# Patient Record
Sex: Male | Born: 1945 | Race: Black or African American | Hispanic: No | Marital: Single | Smoking: Never smoker
Health system: Southern US, Community
[De-identification: ages and names within clinical notes are randomized; demographics above are authoritative.]

## PROBLEM LIST (undated history)

## (undated) DIAGNOSIS — R32 Unspecified urinary incontinence: Secondary | ICD-10-CM

## (undated) DIAGNOSIS — I482 Chronic atrial fibrillation, unspecified: Secondary | ICD-10-CM

## (undated) DIAGNOSIS — Z7901 Long term (current) use of anticoagulants: Secondary | ICD-10-CM

## (undated) DIAGNOSIS — Z9079 Acquired absence of other genital organ(s): Secondary | ICD-10-CM

## (undated) DIAGNOSIS — I10 Essential (primary) hypertension: Secondary | ICD-10-CM

## (undated) HISTORY — DX: Unspecified urinary incontinence: R32

## (undated) HISTORY — DX: Essential (primary) hypertension: I10

## (undated) HISTORY — PX: COLONOSCOPY: GILAB00002

## (undated) HISTORY — DX: Chronic atrial fibrillation, unspecified: I48.20

## (undated) HISTORY — DX: Long term (current) use of anticoagulants: Z79.01

## (undated) HISTORY — DX: Acquired absence of other genital organ(s): Z90.79

## (undated) HISTORY — PX: APPENDECTOMY: SHX54

---

## 2003-10-04 DIAGNOSIS — I482 Chronic atrial fibrillation, unspecified: Secondary | ICD-10-CM

## 2003-10-04 HISTORY — DX: Chronic atrial fibrillation, unspecified: I48.20

## 2006-10-03 DIAGNOSIS — Z9079 Acquired absence of other genital organ(s): Secondary | ICD-10-CM

## 2006-10-03 HISTORY — DX: Acquired absence of other genital organ(s): Z90.79

## 2012-02-01 HISTORY — PX: STRABISMUS CORRECTION, SURGICAL: SHX001610

## 2017-09-08 ENCOUNTER — Emergency Department (HOSPITAL_COMMUNITY): Payer: No Typology Code available for payment source

## 2017-09-08 ENCOUNTER — Emergency Department (HOSPITAL_COMMUNITY)
Admission: EM | Admit: 2017-09-08 | Discharge: 2017-09-08 | Disposition: A | Discharge status: Home / Self Care | Payer: No Typology Code available for payment source | Attending: Emergency Medicine | Admitting: Emergency Medicine

## 2017-09-08 ENCOUNTER — Encounter (HOSPITAL_COMMUNITY): Payer: Self-pay

## 2017-09-08 DIAGNOSIS — R109 Unspecified abdominal pain: Secondary | ICD-10-CM | POA: Insufficient documentation

## 2017-09-08 DIAGNOSIS — R202 Paresthesia of skin: Secondary | ICD-10-CM | POA: Insufficient documentation

## 2017-09-08 DIAGNOSIS — Y999 Unspecified external cause status: Secondary | ICD-10-CM | POA: Insufficient documentation

## 2017-09-08 DIAGNOSIS — S8001XA Contusion of right knee, initial encounter: Secondary | ICD-10-CM

## 2017-09-08 DIAGNOSIS — S0990XA Unspecified injury of head, initial encounter: Secondary | ICD-10-CM | POA: Diagnosis present

## 2017-09-08 DIAGNOSIS — S20211A Contusion of right front wall of thorax, initial encounter: Secondary | ICD-10-CM | POA: Insufficient documentation

## 2017-09-08 DIAGNOSIS — Y929 Unspecified place or not applicable: Secondary | ICD-10-CM | POA: Insufficient documentation

## 2017-09-08 DIAGNOSIS — Y939 Activity, unspecified: Secondary | ICD-10-CM | POA: Diagnosis not present

## 2017-09-08 LAB — COMPREHENSIVE METABOLIC PANEL
ALBUMIN: 4 g/dL (ref 3.5–5.0)
ALK PHOS: 53 U/L (ref 38–126)
ALT: 23 U/L (ref 17–63)
AST: 22 U/L (ref 15–41)
Anion gap: 8 (ref 5–15)
BILIRUBIN TOTAL: 0.8 mg/dL (ref 0.3–1.2)
BUN: 18 mg/dL (ref 6–20)
CALCIUM: 8.9 mg/dL (ref 8.9–10.3)
CO2: 25 mmol/L (ref 22–32)
Chloride: 104 mmol/L (ref 101–111)
Creatinine, Ser: 1.07 mg/dL (ref 0.61–1.24)
GFR calc Af Amer: >60 mL/min (ref >60)
GFR calc non Af Amer: >60 mL/min (ref >60)
GLUCOSE: 92 mg/dL (ref 65–99)
POTASSIUM: 4.2 mmol/L (ref 3.5–5.1)
SODIUM: 137 mmol/L (ref 135–145)
TOTAL PROTEIN: 7.4 g/dL (ref 6.5–8.1)

## 2017-09-08 LAB — CBC WITH DIFFERENTIAL/PLATELET
BASOS ABS: 0 10*3/uL (ref 0.0–0.1)
Basophils Relative: 0 %
EOS PCT: 1 %
Eosinophils Absolute: 0 10*3/uL (ref 0.0–0.7)
HEMATOCRIT: 45.2 % (ref 39.0–52.0)
Hemoglobin: 15.7 g/dL (ref 13.0–17.0)
LYMPHS PCT: 45 %
Lymphs Abs: 1.5 10*3/uL (ref 0.7–4.0)
MCH: 31.2 pg (ref 26.0–34.0)
MCHC: 34.7 g/dL (ref 30.0–36.0)
MCV: 89.7 fL (ref 78.0–100.0)
MONO ABS: 0.3 10*3/uL (ref 0.1–1.0)
MONOS PCT: 9 %
Neutro Abs: 1.5 10*3/uL — ABNORMAL LOW (ref 1.7–7.7)
Neutrophils Relative %: 45 %
PLATELETS: 200 10*3/uL (ref 150–400)
RBC: 5.04 MIL/uL (ref 4.22–5.81)
RDW: 13.8 % (ref 11.5–15.5)
WBC: 3.4 10*3/uL — ABNORMAL LOW (ref 4.0–10.5)

## 2017-09-08 MED ORDER — IOPAMIDOL (ISOVUE-300) INJECTION 61%
INTRAVENOUS | Status: AC
  Administered 2017-09-08: 100 mL via INTRAVENOUS
  Filled 2017-09-08: qty 100

## 2017-09-08 MED ORDER — ONDANSETRON HCL 4 MG/2ML IJ SOLN
4.0000 mg | Freq: Once | INTRAMUSCULAR | Status: AC
  Administered 2017-09-08: 4 mg via INTRAVENOUS
  Filled 2017-09-08: qty 2

## 2017-09-08 MED ORDER — FENTANYL CITRATE (PF) 100 MCG/2ML IJ SOLN
50.0000 ug | Freq: Once | INTRAMUSCULAR | Status: AC
  Administered 2017-09-08: 50 ug via INTRAVENOUS
  Filled 2017-09-08: qty 2

## 2017-09-08 NOTE — ED Notes (Signed)
Ambulated pt. Pt's gait steady on his feet.
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
## 2017-09-08 NOTE — ED Notes (Signed)
Pt. States he has a sharp pain running up and down his back.
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
## 2017-09-08 NOTE — Discharge Instructions (Signed)
You had CT scans performed in the emergency department today.  The scan of your neck showed that you have arthritis in your neck and will need to be followed up by your family doctor.  You can take Tylenol or ibuprofen, available over-the-counter as needed for pain according to label instructions.  Get rechecked immediately if you develop any numbness, weakness, severe pain or new concerning symptoms.

## 2017-09-14 ENCOUNTER — Encounter (HOSPITAL_COMMUNITY): Payer: Self-pay | Admitting: Emergency Medicine

## 2017-09-14 ENCOUNTER — Emergency Department (HOSPITAL_COMMUNITY)
Admission: EM | Admit: 2017-09-14 | Discharge: 2017-09-14 | Disposition: A | Discharge status: Home / Self Care | Payer: Medicare Other | Attending: Emergency Medicine | Admitting: Emergency Medicine

## 2017-09-14 DIAGNOSIS — E7849 Other hyperlipidemia: Secondary | ICD-10-CM | POA: Insufficient documentation

## 2017-09-14 DIAGNOSIS — Z8546 Personal history of malignant neoplasm of prostate: Secondary | ICD-10-CM | POA: Insufficient documentation

## 2017-09-14 DIAGNOSIS — H9313 Tinnitus, bilateral: Secondary | ICD-10-CM | POA: Insufficient documentation

## 2017-09-14 DIAGNOSIS — K625 Hemorrhage of anus and rectum: Secondary | ICD-10-CM | POA: Diagnosis not present

## 2017-09-14 DIAGNOSIS — K649 Unspecified hemorrhoids: Secondary | ICD-10-CM | POA: Insufficient documentation

## 2017-09-14 DIAGNOSIS — S39012D Strain of muscle, fascia and tendon of lower back, subsequent encounter: Secondary | ICD-10-CM | POA: Diagnosis not present

## 2017-09-14 LAB — COMPREHENSIVE METABOLIC PANEL
ALBUMIN: 4.2 g/dL (ref 3.5–5.0)
ALT: 29 U/L (ref 17–63)
AST: 28 U/L (ref 15–41)
Alkaline Phosphatase: 64 U/L (ref 38–126)
Anion gap: 8 (ref 5–15)
BUN: 21 mg/dL — AB (ref 6–20)
CHLORIDE: 104 mmol/L (ref 101–111)
CO2: 24 mmol/L (ref 22–32)
CREATININE: 1.07 mg/dL (ref 0.61–1.24)
Calcium: 8.8 mg/dL — ABNORMAL LOW (ref 8.9–10.3)
GFR calc Af Amer: >60 mL/min (ref >60)
Glucose, Bld: 115 mg/dL — ABNORMAL HIGH (ref 65–99)
POTASSIUM: 3.9 mmol/L (ref 3.5–5.1)
SODIUM: 136 mmol/L (ref 135–145)
Total Bilirubin: 0.7 mg/dL (ref 0.3–1.2)
Total Protein: 7.4 g/dL (ref 6.5–8.1)

## 2017-09-14 LAB — CBC
HEMATOCRIT: 43.8 % (ref 39.0–52.0)
Hemoglobin: 15.3 g/dL (ref 13.0–17.0)
MCH: 31.4 pg (ref 26.0–34.0)
MCHC: 34.9 g/dL (ref 30.0–36.0)
MCV: 89.8 fL (ref 78.0–100.0)
Platelets: 211 10*3/uL (ref 150–400)
RBC: 4.88 MIL/uL (ref 4.22–5.81)
RDW: 13.5 % (ref 11.5–15.5)
WBC: 3.6 10*3/uL — AB (ref 4.0–10.5)

## 2017-09-14 LAB — ABO/RH: ABO/RH(D): O POS

## 2017-09-14 LAB — TYPE AND SCREEN
ABO/RH(D): O POS
Antibody Screen: NEGATIVE

## 2017-09-14 MED ORDER — HYDROCORTISONE ACETATE 25 MG RE SUPP: 25.0000 mg | Freq: Two times a day (BID) | RECTAL | 0 refills | Status: AC

## 2017-09-14 MED ORDER — METHOCARBAMOL 500 MG PO TABS: 500.0000 mg | ORAL_TABLET | Freq: Three times a day (TID) | ORAL | 0 refills | Status: AC | PRN
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
## 2017-09-14 NOTE — Discharge Instructions (Signed)
Continue as needed Tylenol and Motrin.as needed Tylenol and Motrin. Robaxin, muscle relaxant as needed. Return to ER with worsening bleeding, passage of blood without bowel movement, lightheadedness, or other changes.
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
## 2018-06-26 ENCOUNTER — Encounter: Payer: Self-pay | Admitting: Urology

## 2018-07-23 ENCOUNTER — Ambulatory Visit: Payer: Medicare Other | Admitting: Urology

## 2018-07-30 DIAGNOSIS — Z7901 Long term (current) use of anticoagulants: Secondary | ICD-10-CM | POA: Insufficient documentation

## 2018-08-16 ENCOUNTER — Ambulatory Visit: Payer: Medicare Other | Attending: Urology | Admitting: Urology

## 2018-08-16 ENCOUNTER — Encounter: Payer: Self-pay | Admitting: Urology

## 2018-08-16 VITALS — BP 127/84 | HR 97

## 2018-08-16 DIAGNOSIS — N393 Stress incontinence (female) (male): Principal | ICD-10-CM | POA: Insufficient documentation

## 2018-08-16 DIAGNOSIS — Z9689 Presence of other specified functional implants: Secondary | ICD-10-CM | POA: Insufficient documentation

## 2018-08-16 NOTE — Patient Instructions (Addendum)
Please make an appointment for cystoscopy and preoperative appointment with Dr. Toni ArthursBach and myself.     What You Should Know About Cystoscopy      Your urologist has asked you to schedule a test called Cystoscopy.  This test entails inserting a small scope into the urethra (the tube urine comes out of) in order to be able to visualize the structure and anatomy of the urethra and the bladder.    You will first be asked to void (empty out your bladder), and then you will be catheterized to drain any residual urine in your bladder.  The scope (camera) will then be inserted into the urethra after a small amount of local anesthetic jelly is placed into the urethra.  Usually, the scope will be attached to a video camera so you can visualize the inside of your bladder on a monitor while your urologist is examining it.  the test usually takes approximately 10 minutes. Antibiotics are usually not prescribed as they are not routinely indicated.     You will have burning with urination for few hours after the test.  This should resolve on its own.     Why is there a need to undergo Cystoscopy?  Cystoscopy allows your doctor to examine the inside of your bladder.  It is very important to evaluate the structure of the urethra if you have stress incontinence or urethral irritation or pain.  It is important to examine the bladder if you have urge incontinence, bladder pain, blood in your urine, or other abnormalities which require examination of the inside of the bladder.  Cystoscopy is the only reliable test for identification of bladder stones, tumors, and other abnormalities within the bladder such as a stitch from a previous surgery, a fistula, or other abnormalities which may be responsible for your symptoms.      * This information is for educational purposes only and should not be relied upon as medical advice.  It has not been designed to replace a physician's independent judgment about the appropriateness or risks of a  procedure for a given patient.

## 2018-08-16 NOTE — Progress Notes (Addendum)
No Pcp Per Patient  No address on file    WJ:XBJYNWGRE:Charles Decker  MRN: [id number]  DOB: 01/15/1946    Date of visit: 08/16/2018    Dear Patient,    Chief complaint:   Chief Complaint   Patient presents with    Consultation       HPI:  Today in the Urology Clinic, I had the pleasure of seeing Charles Decker who is a [age]yr male referred for consultation regarding post prostatectomy stress urinary incontinence. Patient is here in clinic today together with his wife. Patient and his wife have lived in [place] and [place], [place] and [place] in [place]. Patient has a reported history of prostate cancer s/p robotic prostatectomy approximately Oct 2008. Patient developed post prostatectomy urinary incontinence, failed physical therapy after 1 year. In July 2010 AUS device #1 was placed by urologist in [place]. Patient states this first AUS device cycled normally, but did not function properly for incontinencne. After one year of urinary incontinence with AUS in place, patient consulted with a different urologist in [place], had second new AUS surgery was done - upon review of EMR paperwork - it looks like second cuff was applied and reservoir was increased in fluid. Patient is uncertain if reservoir was removed, but states he went from single to double cuff at that time. He recalls that a scrotal incision was made. Additionally, surgery was complicated by hernia, which was repaired with mesh as the time of AUS #3 replacement. AUS #3 was 75% effective by [facility]. Third AUS was very effective in treating urinary incontinence for several years. However, patient developed worsening stress incontinence 3 months ago. He states that the device is working 50%. Leakage is managed with 4-5 pads daily. Patient believes that AUS reservoir may have shifted from right to left scrotum. He confirms decreased fluid in the reservoir. Patient and his wife would like to discuss removal and  replacement of AUS today. Pt leaks 4-5 large pads per day.     Patient has additional pertinent medical history of afib and hypertension, currently on Coumadin, previously monitored by cardiologist in [place]. Patient's wife states that after moving from [place] 1 year ago, cardiologist was satisfied with current cardiac care. He is now mostly followed by his PCP. Patient states he has not yet discussed desire to have surgery and cardiac care with his PCP. Patient reports normal PSA levels since prostatectomy.     I personally reviewed and summarized old medical records and have submitted to be scanned into our EMR selected documents: no    Images:  I personally viewed the following images: No images available.  I discussed test results with performing physician: no  I independently visualized images myself, no report was available: no    Labs:  No results found for: NA, K, CL, CO2, BUN, CR, GLU  No results found for: WBC, HGB, HCT, PLT    Allergies:   Patient has no known allergies.     Past Medical History:  Past Medical History:   Diagnosis Date    A-fib (HCC)     Hypertension         Past Surgical History:  Past Surgical History:   Procedure Laterality Date    INSERTION, ARTIFICIAL URINARY SPHINCTER, ADULT      REMOVAL, ARTIFICIAL SPHINCTER, URINARY         Family History:  No family history on file.     Social History:  Social  History     Tobacco Use    Smoking status: Never Smoker    Smokeless tobacco: Never Used   Substance Use Topics    Alcohol use: Not on file       No current outpatient medications on file.     No current facility-administered medications for this visit.        Review of Systems:   A complete review of symptoms reveal no headache, no fever/ chills, no CP, no SOB, no abdominal pain, no edema in extremities. Please see HPI, all other systems are negative    Physical Exam:   BP 127/84 (SITE: left arm, Orthostatic Position: sitting, Cuff Size: regular)   Pulse 97   Appearance: in no  apparent distress, appears to be in good physical condition, mood appropriate, responds appropriately to spoken word with non labored breathing.    CV: Pulses intact  Resp: Unlabored breathing  Abd: Soft, NT, ND,   Skin: No excoriations, lacerations, or rashes  Extremities: No cyanosis, clubbing, or edema  Neuro: Gait normal. Reflexes normal and symmetric. Sensation and strength grossly normal  Psychiatric: Mood appears normal  GU Exam:  Penis: circumcised, Meatus:  appearing  normal, Testes:  Bilaterally, right sided AUS in place   Right lower quadrant incision well healed. No tenderness, induration or signs of infection along the tubing, pump, or with palpation of cuff. AUS was cycled with limited fluid felt within pump  Pump in midline of hemiscrotum with palpable tubing, suspect potential penoscrotal placement?   Chaperone was present during physical exam.     Impression:   In review, this is a 72yr old male with history of prostate cancer s/p RALP Oct 2008 resulting in stress urinary incontinence s/p 3 AUS placements, now with worsening urinary incontinence. Additional history of Afib and hypertension, on Coumadin.  Hx of right inguinal hernia repair with mesh.  Current AUS reservoir on right.     Assessment:  (N39.3) SUI (stress urinary incontinence), male  (primary encounter diagnosis)    Comment:   - We discussed indicated surgical interventions, including AUS removal and replacement. I explained to the patient that given history of 3 AUS placements, there is higher risk of device erosion and urethral atrophy. Informed the patient that the AUS device will be turned off for 6 weeks postoperatively, which will result in temporary urinary incontinence. Patient is amenable to plan and understands risks of the procedure.     Plan:   I addressed stress urinary incontinence with the patient today, which is a new problem for [name]. This issue is stable.    -We reviewed the lifestyle modifications patient can take to  help with male stress urinary incontinence. Conservative management was discussed such as limiting fluid intake, avoiding caffeine and alcohol, and exercising pelvic flor muscles which may provide temporary SUI relief. Currently, no medications are approved in the [place] for treatment of male SUI. We reviewed non-surgical treatment options which include absorbent products like pads or diapers, external penile clamps, and interior or exterior penile catheters. Surgical options include slings, artificial urinary sphincters, and bulking agents were reviewed in depth.    -We discussed the AMS 800 3-part Urinary Control System, also known as the Artifical Urinary Sphincter. The pump is implanted in the scrotum, the inflatable cuff fits around the urethra, and balloon reservoir is implanted in he abdomen. The AUS is designed to treat male SUI following prostate surgery, and is most commonly used to treat moderate to severe male  SUI. Published clinical studies show that 59%-90% used 0-1 pad per day after the procedure, and 96% would recommend an AMS 800 implant to a friend. This option is patient operated and requires good cognitive ability and manual dexterity. Side effects were reviewed with the patient including: pain, discomfort and inflammation, bleeding and irritation at the wound site, urethra and/or surrounding tissue damage, healing delays, and recurrent urinary incontinence, local nerve damage.   -Patient education material was provided to the patient including a DVD for him to watch at home. Extensive amount of time was spent counseling the patient on his options and demonstrating the device in a hands on manner with a model. I reinforced the concept that I am not guaranteeing complete continence with either surgical option, and the goal of this surgery would be to improve their quality of life, but may still require pad usage for incontinence. Patient acknowledges these risks and potential  benefits and expressed understanding of the above.  -Pt expressed interested in proceeding with the AUS removal and replacement, which I think would be in his best interest given his level of physical activity and amount of incontinence.     - Return to clinic for cystoscopy and preoperative appointment for AUS removal and replacement with myself. Also consultation with Dr. Toni Arthurs for history of afib, same day if possible given that patient lives in [place]. SURGICAL ADMISSION CENTER REFERRAL placed.   - Patient was advised to follow up with his PCP regarding Afib prior to surgery. Coumadin should be stopped prior to surgery.   -Pt advised he is at high risk that I might not be able to remove the reservoir due to hx of right inguinal hernia repair with mesh. He is at high risk for erosion and atrophy.   -Requested that pt provide operative notes from his prior AUS surgeries, if not all of them, at least the last/final AUS #3 and hernia repair surgery by Dr. Orlin Hilding. Unclear if was placed penoscrotal or with perineal incision.   -HAR submitted.     -I discussed this patient's case with another health care provider: no    --------------------------------------------------------------------------------------------    Approximately 40 minutes were spent with patient,  with more than 50% of time spent counseling the patient on the assessment and plan as above. We discussed the indications for treatment. The patient has agreed to the recommended treatment.     I did review patient's past medical and family/social history.  Barriers to Learning assessed: none. Patient verbalizes understanding of teaching and instructions.    Thank you for the opportunity to participate in the health care of your patient.  I will keep you appraised of any further urologic interventions.      Sincerely,    Electronically signed:   Griselda Miner, MD,  MPH    ------------------------------------------------------------------------------------------  SCRIBE DISCLAIMER:   This note was scribed by Lianne Bushy, a trained medical scribe, in the presence of Griselda Miner, MD, MPH on 08/16/18 at 10:46 AM.    PROVIDER DISCLAIMER:  This document serves as my personal record of services taken in my presence. It was created on 08/16/2018 on my behalf by Lianne Bushy, a trained medical scribe.    I have reviewed this document and agree that this note accurately reflects the history and exam findings, the patient care provided, and my medical decision making.    08/16/2018  Griselda Miner, MD, MPH  Assistant Professor  Department of Urology  Male Pelvic Medicine and Reconstructive Surgery  [facility] [phone]

## 2018-08-16 NOTE — Nursing Note (Signed)
Patient identified using name and date of birth.  Vitals taken, chief complaint identified, drug allergies verified, screened for pain, medications reviewed, pharmacy confirmed. Laranda Burkemper H, LVN

## 2018-08-30 ENCOUNTER — Other Ambulatory Visit: Payer: Self-pay | Admitting: SURGERY

## 2018-08-30 DIAGNOSIS — N393 Stress incontinence (female) (male): Principal | ICD-10-CM

## 2018-08-30 NOTE — H&P (Addendum)
Interval History & Physical    Date of Service: .09/12/2018  Date of Admission: 09/12/2018  Procedure: 09/12/2018     Mr. Janee Mornhompson is a [age]year old male with history of prostate cancer s/p RALP in 2008 c/b post op SUI. He has had 3 AUS placements/reveision so far.   #1: July 2010. [place]. [facility], [place]. [phone]. [address] [place] [address] [place]  Device cycled normally but SUI did not improve.   #2: April 2011. Dr. Carlena Saxaub. [phone]. [address], [place] [facility]. It looks like second cuff was applied and reservoir was increased in fluid. Patient is uncertain if reservoir was removed, but states he went from single to double cuff at that time. He recalls that a scrotal incision was made. Surgery complicated by hernia. The hernia was repaired with mesh during the third AUS placement.   #3: July 2013. Dr. Orlin HildingBroderick. [facility]. Very effective in treating urinary incontinence for several years. However, patient developed worsening stress incontinence 3 months ago. He states that the device is working 50%. Leakage is managed with 4-5 pads daily.    Office cystoscopy on 12/9 demonstrated double cuffs beyond the sphincter. No signs of urethral erosion noted.     PCa:   Last PSA <0.03 in 09/2017.     Medications:   Lisinopril 40mg  tablet dialy   Pravastatin 40mg  daily   Warfarin 7.5mg  daily     Past Medical History:   Diagnosis Date    Chronic anticoagulation     warfarin since 2005 for afib (stop 5 days prior to surgery); home INR 2.2s (warfarin 7.5mg ) ->PCP Dr Helene ShoeBosh    Chronic atrial fibrillation 2005    on warfarin 7.5mg  qhs -> stop 5 days prior to surgery; no enoxaparin bridging; no CHF, MI, stroke    H/O prostatectomy 2008    prostate cancer -> voiding dysfunction (3 Artificial urinary sphincter 2010 -2013    Hypertension     Urinary incontinence     postprostatectmy since 2008     Past Surgical History:   Procedure Laterality Date     ------------OTHER-------------      07/2007 robotic  Prostatectomy -->   prostatectomy stress  Urinary incontinence  -> [facility]([place]  04/2009 AUS#1 (cycled normally , did not improve incontinence) --> AUS #2 (single - double cuff)   Complicated with hernia  -->  ([facility]  04/2012  ) AUS #3 reposition  &  repair of right inguinal hernia with mesh    COLONOSCOPY      2013    STRABISMUS CORRECTION, SURGICAL Right 02/2012    [facility]     Social History     Socioeconomic History    Marital status: MARRIED     Spouse name: Not on file    Number of children: Not on file    Years of education: Not on file    Highest education level: Not on file   Occupational History    Not on file   Social Needs    Financial resource strain: Not on file    Food insecurity:     Worry: Not on file     Inability: Not on file    Transportation needs:     Medical: Not on file     Non-medical: Not on file   Tobacco Use    Smoking status: Never Smoker    Smokeless tobacco: Never Used   Substance and  Sexual Activity    Alcohol use: Not Currently     Comment: no alcohol     Drug use: Never    Sexual activity: Not on file   Lifestyle    Physical activity:     Days per week: Not on file     Minutes per session: Not on file    Stress: Not on file   Relationships    Social connections:     Talks on phone: Not on file     Gets together: Not on file     Attends religious service: Not on file     Active member of club or organization: Not on file     Attends meetings of clubs or organizations: Not on file     Relationship status: Not on file    Intimate partner violence:     Fear of current or ex partner: Not on file     Emotionally abused: Not on file     Physically abused: Not on file     Forced sexual activity: Not on file   Other Topics Concern    Not on file   Social History Narrative    Not on file       Temp: 36.2 C (97.2 F) (12/11 0838)  Temp src: Core (12/11 0838)  Pulse: 87 (12/11  0838)  BP: 111/94 (12/11 0838)  Resp: 16 (12/11 0838)  SpO2: 98 % (12/11 0838)  Height: 185.4 cm (6\' 1" ) (12/11 0838)  Weight: 87.2 kg (192 lb 3.9 oz) (12/11 1610)    Gen:  Alert, oriented X4.  Chest: Clear  Heart: Regular   Abd:  Soft, non-distended, non-tender  Ext:  Moving extremities   GU: circumcised penis without lesions or masses     Labs  Reviewed    The indication for surgery is still present.   he is in adequate condition to undergo the planned procedure.   Consent is in the chart for review.   Stopped taking warfarin 5 days ago.     Proceed with artificial urethral sphincter placement.     Electronically Signed on 09/12/2018 at 09:25 by:  Valerie Roys, MD   Urologic Surgery, URO 2  PI# [id number]  Personal pager: [phone]  Urologic Surgery Service Pager: [phone]    I saw and evaluated the patient with the resident. Together, we reviewed all pertinent images, labs, and reports for this patient and developed a care plan.  I agree with the resident's documentation of the care plan.    Griselda Miner, MD, MPH  Assistant Professor  Department of Urology  Male Pelvic Medicine and Reconstructive Surgery  [facility] [phone]

## 2018-09-03 ENCOUNTER — Encounter: Payer: Self-pay | Admitting: Internal Medicine

## 2018-09-03 DIAGNOSIS — I482 Chronic atrial fibrillation, unspecified: Secondary | ICD-10-CM | POA: Insufficient documentation

## 2018-09-03 DIAGNOSIS — I10 Essential (primary) hypertension: Secondary | ICD-10-CM | POA: Insufficient documentation

## 2018-09-03 DIAGNOSIS — Z9079 Acquired absence of other genital organ(s): Secondary | ICD-10-CM | POA: Insufficient documentation

## 2018-09-03 DIAGNOSIS — R32 Unspecified urinary incontinence: Secondary | ICD-10-CM | POA: Insufficient documentation

## 2018-09-03 NOTE — Progress Notes (Addendum)
Mr Charles ConnorsWilliam Decker ([id number]) Charles Decker 12/9 Dr Griselda [facility]  Revision of artificial urinary sphincter on 09/12/2018     Dr  Leanor RubensteinStephanie Bosch  [address] [place] [id number]; PCP [phone];   Dr Pasty Spillersalph Abadier [address], [place], [place] [id number] Card [phone]; Fax [phone]      72 yo     +2008 prostate adenocarcinoma   With prostatectomy & 3 artificial urinary sphincter placement of urinary incontinence  -> 09/2017 PSA < 0.03     07/2007 robotic  Prostatectomy -->   prostatectomy stress  Urinary incontinence  -> [facility]([place])  04/2009 AUS#1 (cycled normally , did not improve incontinence) --> AUS #2 (single - double cuff)   Complicated with hernia  -->  [facility] AUS #3  &  repair of hernia with mesh    -> 05/2018 recurrent urinary stress incontinence; device working 50% , leakage 4-5 pads per day    ([place]) 08/16/2018     ======    +2005 atrial fibrillation   + chronic anticoagulation (warfarin 7.5mg  ?)     +2002  hypertensive heart disease   + lipid  (no stroke , MI)   10/2013 echo  (LVEF  Mild concentric LVH; type 1 diastolic dysfunction; dilated left atrium 5.5cm; aortic root not dilated;  Right sided dilation;  Diminished RV systolic function; Mild myxomatous degeneration/calcification of mitral valve; Moderate TR/MR; mild PI;  Small LV outflow tract without obstruction; PASP 32mmhg     02/2015 stress sestamibi (10.4 METS,  105% MPHR; LVEF 64%; LVEDV 53; normal wall motion; no significant reversible ischemia)     (lisinopril 40mg ) (Pravastatin 40mg )   =====    + BMI  ; + 09/2017 neg hepatitis C Ab     Surgeries    Social history ; Married     [facility]  OR (09/14/2017  Na 138, K 4.4, Cl 102, Co2 25, BUn 18, Cr 0.9, gluc 92, [place] 10.1, alb 4.1, AST 20/ALT 16, ALP 62, T bil 0.7

## 2018-09-10 ENCOUNTER — Ambulatory Visit: Payer: Medicare Other | Attending: Urology

## 2018-09-10 ENCOUNTER — Ambulatory Visit: Payer: Medicare Other | Attending: Internal Medicine | Admitting: Internal Medicine

## 2018-09-10 ENCOUNTER — Encounter: Payer: Self-pay | Admitting: Internal Medicine

## 2018-09-10 ENCOUNTER — Encounter: Payer: Self-pay | Admitting: Urology

## 2018-09-10 ENCOUNTER — Ambulatory Visit
Admission: RE | Admit: 2018-09-10 | Discharge: 2018-09-10 | Disposition: A | Discharge status: Home / Self Care | Payer: Medicare Other | Source: Ambulatory Visit | Attending: Diagnostic Radiology | Admitting: Diagnostic Radiology

## 2018-09-10 ENCOUNTER — Ambulatory Visit: Payer: Medicare Other | Attending: Urology | Admitting: Urology

## 2018-09-10 VITALS — BP 124/78 | HR 78 | Resp 14 | Ht 73.0 in | Wt 190.0 lb

## 2018-09-10 VITALS — BP 133/83 | HR 99 | Temp 98.3°F | Resp 16

## 2018-09-10 DIAGNOSIS — R9431 Abnormal electrocardiogram [ECG] [EKG]: Secondary | ICD-10-CM

## 2018-09-10 DIAGNOSIS — N393 Stress incontinence (female) (male): Secondary | ICD-10-CM | POA: Insufficient documentation

## 2018-09-10 DIAGNOSIS — I519 Heart disease, unspecified: Secondary | ICD-10-CM | POA: Insufficient documentation

## 2018-09-10 DIAGNOSIS — I482 Chronic atrial fibrillation, unspecified: Secondary | ICD-10-CM | POA: Insufficient documentation

## 2018-09-10 DIAGNOSIS — Z9079 Acquired absence of other genital organ(s): Secondary | ICD-10-CM | POA: Insufficient documentation

## 2018-09-10 DIAGNOSIS — Z01818 Encounter for other preprocedural examination: Principal | ICD-10-CM | POA: Insufficient documentation

## 2018-09-10 DIAGNOSIS — Z9689 Presence of other specified functional implants: Secondary | ICD-10-CM | POA: Insufficient documentation

## 2018-09-10 DIAGNOSIS — I10 Essential (primary) hypertension: Secondary | ICD-10-CM

## 2018-09-10 DIAGNOSIS — Z7901 Long term (current) use of anticoagulants: Secondary | ICD-10-CM | POA: Insufficient documentation

## 2018-09-10 DIAGNOSIS — I4891 Unspecified atrial fibrillation: Secondary | ICD-10-CM

## 2018-09-10 DIAGNOSIS — Z8546 Personal history of malignant neoplasm of prostate: Secondary | ICD-10-CM | POA: Insufficient documentation

## 2018-09-10 DIAGNOSIS — R32 Unspecified urinary incontinence: Secondary | ICD-10-CM

## 2018-09-10 DIAGNOSIS — I119 Hypertensive heart disease without heart failure: Secondary | ICD-10-CM | POA: Insufficient documentation

## 2018-09-10 LAB — BASIC METABOLIC PANEL
CALCIUM: 9.5 mg/dL (ref 8.6–10.5)
CARBON DIOXIDE TOTAL: 27 mmol/L (ref 24–32)
CHLORIDE: 104 mmol/L (ref 95–110)
CREATININE BLOOD: 1.05 mg/dL (ref 0.44–1.27)
E-GFR, AFRICAN AMERICAN: 80 mL/min/{1.73_m2}
E-GFR, NON-AFRICAN AMERICAN: 70 mL/min/{1.73_m2}
GLUCOSE: 110 mg/dL — AB (ref 70–99)
POTASSIUM: 4.2 mmol/L (ref 3.3–5.0)
SODIUM: 140 mmol/L (ref 135–145)
UREA NITROGEN, BLOOD (BUN): 15 mg/dL (ref 8–22)

## 2018-09-10 LAB — CBC NO DIFFERENTIAL
HEMATOCRIT: 45.2 % (ref 41.0–53.0)
HEMOGLOBIN: 15.1 g/dL (ref 13.5–17.5)
MCH: 30.3 pg (ref 27.0–33.0)
MCHC: 33.4 % (ref 32.0–36.0)
MCV: 90.7 fL (ref 80.0–100.0)
MPV: 10 fL (ref 6.8–10.0)
PLATELET COUNT: 158 10*3/uL (ref 130–400)
RDW: 14.5 % (ref 0.0–14.7)
RED CELL COUNT: 4.98 10*6/uL (ref 4.50–5.90)
WHITE BLOOD CELL COUNT: 5.5 10*3/uL (ref 4.5–11.0)

## 2018-09-10 NOTE — Progress Notes (Signed)
Vital signs taken, allergies verified, screened for pain, med hx taken. No acute distress noted at this time.     Patient prepped for cystoscopy. Allergies verified: Lidocaine : no, Betadine : no, Latex : no, Cipro :no, Sulfa :  no.  History of bladder cancer : no.  Prior reaction during or following a cystoscopy : no. Scope used: 4536    Pre Procedure Check List - Short Version    ID verified by two sources (select any two from list): DOB and Name  Site: Bladder  Procedure: Cystoscopy  Surgical/Procedure pause: Yes  Site(s) marked: N/A  Position: N/A  Consent: Available; reviewed by MD  History and Physical: Available; reviewed by MD  Other relevant documentation:  N/A  Relevant images:  N/A  Implants: N/A  Special Equipment:  N/A  Blood products matched:  N/A  Other pre-procedural information:  N/A  Patient concurs: Yes  Team present and concurs:  Yes    Albirda Shiel, LVN

## 2018-09-10 NOTE — Progress Notes (Signed)
No Pcp Per Patient  No address on file    OZ:[id number]:Charles Decker  MRN: [id number]  DOB: 11/09/1945    Date of visit: 09/10/2018  Chief complaint: Follow up visit for cystoscopy.     Dear Dr. Patient, No Pcp Per:    History of Present Illness (HPI)  Today in the Urology Clinic, I had the pleasure of seeing Charles Decker who is a [age]yr male in follow-up regarding upcoming AUS revision.  We performed in-office flexible cystoscopy today for further work-up.      Labs: I personally reviewed the following lab(s) and results were discussed with the patient today in clinic. None     Images: I personally reviewed the following radiological image(s) and results were discussed with the patient today in clinic. None.     Cystoscopy    Pre op diagnosis: prior AUS placement   Post op diagnosis: same     CYSTOSCOPIC EXAMINATION: After informed consent, the patient was placed on the examining table in the dorsal lithotomy position and prepped and draped in the usual manner.  10 cc of 2% lidocaine gel was used to anesthetize the urethra.  Use of 500ml .9% saline solution. Flexible cystoscope was used to visualize the urethra and the bladder.   His AUS was de-activated prior to the procedure.   It was re-activated after cystoscopy.     FINDINGS   urethra: healthy, double cuffs distal to the sphincter, no erosion noted, urethrla atrophy at both cuff sites.   bladder neck: normal  The bladder was inspected in the normal meridian fashion.  No tumors, masses, or lesions were identified. Normal capacity yes. Trabeculations:no.  Bladder diverticula no.  Both ureteral orifices appeared to be in their normal anatomic location and appearance.      I performed the entire procedure.    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   Today's cystoscopy showed healthy urethra with  double cuffs beyond the sphincter. No urethral erosions seen. Bladder overall appeared normal.     Recommendations and Plan:  - Proceed with scheduled AUS revision on 09/12/2018.  - Pt states he has poor erection function at baseline. Informed him that we may need to use corpora tissue during the OR if required as a transcorporal procedure. He was also informed that his AUS will be deactivated for 6 weeks post op and re-activated afterwards. 2 week post op for wound check. Patient voiced understanding.    - Chlorprep provided to patient    - Pre op labs: CBC, BMP, CXR, EKG   - UA obtained at OSH (negative nitrite and LE)  -Previous operative summary reviewed, pt had revision of reservoir - it was involved in right hernia, right hernia repaired with mesh and new reservoir was placed, tandem cuffs were not operated on.  Pt with pump in middle scrotum, prior surgery was transcrotal placement.  Advised pt he is at high risk for urethral atrophy and erosion.  He may also have two abdominal incisions and I may not be able to remove prior reservoir. Pt agrees with these risks and would ike to pursue surgery.    Thank you for the opportunity to participate in the health care of your patient.  I will keep you appraised of any further urologic interventions.  Sincerely,  Valerie Roys, MD   Urologic Surgery, URO 2  PI# [id number]  Personal pager: [phone]  Urologic Surgery Service Pager: [phone]

## 2018-09-10 NOTE — Progress Notes (Signed)
RE: Charles Decker  Charles#: [id number]  DOB: 13-Jul-1946  Date of Service: 09/10/2018    Dr Griselda Miner   Urology clinic    Dear Dr. Nanda Quinton:    Thank you very much for your consultation request for Charles Decker.  I had the pleasure of seeing him in [facility] today for medical preoperative risk assessment prior to   Revision of artificial urinary sphincter on 09/12/2018     Information is obtained from Charles Decker and Charles Decker medical records   +Dr  Leanor Rubenstein  [address] [place] [address]; PCP [phone];   Dr Pasty Spillers [address] [place], [place], [place] [address] Card [phone]; Fax [phone]       Charles. Decker confirmed the following review of systems:     No fever, chills, URI, chronic coughing, wheezing, home O2, CPAP, snoring, hypersomnolence, chest pain, palpitation, postural dizziness, syncope, seizure    +infrequent  dysphagia with dry fish or chicken breast - occasional sticking - better with water  ; no aspiration, dysphagia, PND, orthopnea, regurgitation;  Normal bowel function;     2008 prostatectomy -> 3 artificial urinary sphincters fro m2010 -  2014 (also had right inguinal hernia with mesh repair in 2014 with 3rd AUS); 06/2018 urinary leakage ; no hematuria, kidney stone, dysuria     No chronic headache, TMJ pain; + dental implants & 2 upper/lower dental bridges; no oral ulcers;  Has full neck ROM without neck pain/radicular symptoms; no back pain; intermittent left knee pain (basketball injury - worse with walking 10 miles) ; no sacral/foot ulcer , neuropathy    All systems were reviewed with the patient. Except for the pertinent positive and negative symptoms highlighted above, the other systems were negative.    Functional Capacity: climbs 30 steps in 3 story home  X several times per day; walks 3-5 miles per day x 1-2 per week; no dyspnea or chest pain     Past Medical & Surgical History:no asthma, sleep apnea, diabetes, , DVT/PE, cirrhosis, MI, stroke, CHF,  thyroid disorder     Charles Decker ([id number])    72 yo right handed nonsmoker; no alcohol, drug use     + ([facility] ) 2008 prostate adenocarcinoma   With prostatectomy & 3 artificial urinary sphincter placement of urinary incontinence  -> 09/2017 PSA < 0.03     07/2007 robotic  Prostatectomy -->   prostatectomy stress  Urinary incontinence  -> [facility])  04/2009 AUS#1 (cycled normally , did not improve incontinence) --> AUS #2 (single - double cuff)   Complicated with hernia  -->  ([facility]  04/2012  ) AUS #3 reposition  &  repair of right inguinal hernia with mesh    -> 05/2018 recurrent urinary stress incontinence; device working 50% , leakage 4-5 pads per day    ======    +2005 chronic atrial fibrillation   (no stroke CHF, thyroid disorder, cardioversion)     +  2002 chronic anticoagulation  INR 2.2 - home monitoring (warfarin 7.5mg qhs; ; home INR ->  coordinated by Dr Shellia Cleverly - stop 5 days prior to surgery )     +2002  hypertensive heart disease  (weekly BP at home 120s/80s)   + lipid  (no stroke , MI)   10/2013 echo  (LVEF  Mild concentric LVH; type 1 diastolic dysfunction; dilated left atrium 5.5cm; aortic root not dilated;  Right sided dilation;  Diminished RV systolic function;  Mild myxomatous degeneration/calcification of mitral valve; Moderate TR/Charles; mild PI;  Small LV outflow tract without obstruction; PASP 32mmhg     02/2015 stress sestamibi (10.4 METS,  105% MPHR; LVEF 64%; LVEDV 53; normal wall motion; no significant reversible ischemia)     (lisinopril 40mg ) (Pravastatin 40mg )   =====    + BMI  25 (1.7531m; 86kg = 190 lb)  ; + 09/2017 neg hepatitis C Ab     Surgeries No anesthetic complication or perioperative  bleeding complication.  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    Family History:  No known family history of anesthetic or perioperative bleeding complication    Medications: per EMR documentation above    Drug Allergy: per EMR documentation above    Physical Exam:   Vital signs recorded in EMR as above  Healthy youthful gentleman without pallor, jaundice, telangiectasia, periorbital edema, lymphadenopathy,  tremor, facial droop.  Airway Mallampati class1 ; good  Dentition/dental bridges  , full neck ROM , thyromental distance >  3 fingerbreadths,  No thyromegaly;  Lungs clear  ;  Cardiac Irreg irreg 80s; hirsute  ; Abd soft , nontender with bowel sound;  No pedal edema  . Gait narrow based, with intact motor strength.  Normal mood , speech and affect.     [facility]  OR (09/14/2017  Na 138, K 4.4, Cl 102, Co2 25, BUn 18, Cr 0.9, gluc 92, [place] 10.1, alb 4.1, AST 20/ALT 16, ALP 62, T bil 0.7  ====  EKG Today: afib 91, QRS 0.09; QTc 0.452 second, no LVH   Pending labs  The result was reviewed and a copy of ECG and pertinent labs were provided for the patient's record    The preoperative risk factors would include:    1) Procedure related risk of bleeding, infection, neurovascular complications and others as discussed in the surgery clinic. The procedure is classified as intermediate risk for cardiac standpoint with less than 5 % event rate of cardiac death or nonfatal myocardial infarction.    2) Patient-related risk due to medical history. He is at risk for cardiac complication, aspiration, venous thromboembolism,  delirium, deconditioning, delayed recovery,exacerbation of chronic arthritic pain, bowel/bladder dysfunction.    As Charles. Janee Mornhompson is at baseline function, there is no preoperative modifiable risk factor. Thus, if surgery is in his interest, and if  he would like to proceed after reviewing the risks and benefits, then he should proceed without further cardiac or pulmonary risk stratification. The recommendation will also be forwarded to the family physician and specialists for their guidance.    The preoperative recommendations include:    1) Preoperatively, he should avoid NSAID or herbal supplement for 1 week prior to surgery to minimize the risk of bleeding  or anesthetic interaction.    2)  Please see After Visit Summary below for specific preoperative instructions reviewed with the patient today.     4) Intraoperatively, beta blocker can be used for hypertensive management as needed.  ACE inhibitor or Angiotensin receptor blocker should be avoided on the day of surgery to minimize the risk of hypotension    5) Postoperatively, early mobilization, DVT prophylaxis, incentive spirometry, bowel care, delirium /aspiration/decubitus ulcer precautions and resumption of medications are indicated.     Please contact the General Medicine , Anticoagulation Consult Team for postop co-management, as indicated.    Thank you very much for your referral of Charles Decker.    Sincerely yours,     Arley Phenix, MD  Health Sciences Clinical Professor  Director, Surgical Admissions Center  Internal Medicine, Anesthesia & Pain Medicine  Phone: [phone]; Fax [phone]  Email: [email or web address]      Cc: Dr  Leanor Rubenstein  [address] [place] [address]; PCP [phone];

## 2018-09-10 NOTE — Progress Notes (Signed)
I was physically present for the entire procedure and participated in the entire procedure.  I reviewed the Cystoscopy Study findings together with the patient in detail.  I also fully reviewed the documentation by the resident and agree with the documentation above and have made the appropriate edits to the above note.    Hilberto Burzynski, MD, MPH  Assistant Professor  Department of Urology  Male Pelvic Medicine and Reconstructive Surgery  [facility]  Clinic [phone]

## 2018-09-10 NOTE — Patient Instructions (Addendum)
Instructions for Day of Surgery 09/12/2018      You will receive a phone call 2 work days (between 2-5 p.m)  before your surgery date to confirm the time of check-in and location for your surgery.    For questions within 1 day prior to your  surgery, please contact your surgeon's office or the phone number  below -     [phone] for surgery scheduled in the Main [facility]       -------------------------------------------------------------------------------------------------------------------    Location for Check-In on Day of Surgery    [facility] [address] [place], [place] [id number]   Main Lobby Admissions Office - [facility] (Room [phone])   ------------------------------------------------------------------------------------------------------------------         Medication    Already stopped warfarin starting saturday 09/08/2018     NO  medications   on the morning of surgery      PLEASE NOTIFY your physicians of all medications (prescribed, over-the counter pills, eye/ear drops, inhalers, topical medications).     Please stop NSAID (motrin, ibuprofen, aleve, naproxen, etc)  & herbal/nutritional supplement (esp. Fish oil, vitamin E , C, gingko, garlic tablets)  1 week prior to surgery to minimize the risk of bleeding or anesthetic interaction. Chronic use of NSAID is also associated with weight gain &  water retention.    Please stop Aspirin 1 week prior to surgery ==>  EXCEPTION:  For cardiac patients with coronary/vascular stents or strokes, please discuss with your surgeon and cardiologist/neurologist  prior to stopping  your antiplatelet therapy (Aspirin, Plavix/Clopidogrel, Effient/Prasugrel, etc) -->  CAUTION - risk of bleeding during surgery  Vs. risk of death and major heart attack & stroke from  clotting off  your stents, heart attacks or strokes.     Please discuss with your surgeon and primary care physician/Anticoagulation clinic if you take blood thinners (Anticoagulant -  Warfarin/Coumadin, Dabigatran/Pradaxa; Apixaban/Eliquis;Rivaroxaban/Xarelto ,etc) or if you had taken blood thinning injections (Enoxaparin/Lovenox) prior to surgeries in the past.    On morning of surgery, No diuretics (HCTZ, Lasix/Furosemide, Metolazone, etc); No ACE inhibitor (Benazepril, Lisinopril, etc) ; no ARB (Losartan, Valsartan, etc)    =============================================================         Fasting guideline      Fasting Guidelines to minimize the risk of aspiration pneumonia & cancellation of surgery for safety concern:      FASTING GUIDELINES FOR ADULTS:     NO solid food including milk and milk products, soup broth or juice with pulp (i.e. [place] juice), mints and hard candies minimum 8 hours before estimated arrival to the hospital.  A slice of dry toast is allowed until 6 hours before estimated arrival to the hospital.    You may continue to drink CLEAR liquids freely up until 2 hours before estimated time of SURGERY.   Clear liquids include: water, clear fruit juice (choice of apple juice or white grape juice) Gatorade (any color except red, purple or blue), carbonated beverages, clear Jell-O gelatin without any food products added like (i.e. fruit or whipped cream) and black coffee or tea without cream, sugar, sweeteners, or milk.    Chewing gum un-swallowed, is allowed up until 2 hours before estimated time of SURGERY.    ================================================================    1) Do not smoke, drink alcohol or use recreational drugs please. Illicit drugs (amphetamine, cocaine, heroin, LSD, etc ) can result in fatal complications (heart attack, stroke, seizure, etc) and must be stopped for at least 1 week prior to your surgery.  Your surgery may be cancelled for your safety if your toxicology screen is positive on the morning of surgery    2) Do not bring valuables or jewelry with you on the day of surgery.  3) Take a bath or shower on the morning of surgery.    4) Do not  apply cosmetics, creams or lotion after bathing.  5) Wear comfortable clothing that either zips or buttons in front.  6) Arrange for transportation to bring you home after your surgery and someone to stay with at home  =>  Your surgery will be cancelled if you do not have adequate transportation.  7) Bring your CPAP or BIPAP machine for sleep apnea if applicable    ===============================================================    Learning About Anesthesia    What is anesthesia?  Anesthesia controls pain. And it keeps all your organs working normally during surgery or another kind of procedure. Anesthesia can relax you. It can also make you sleepy or forgetful. Or it may make you unconscious. It depends on what kind you get.  Your anesthesia provider (anesthesiologist or nurse anesthetist) will make sure you are comfortable and safe during the procedure or surgery.    There are different types of anesthesia.   Local anesthesia. This type numbs a small part of the body. Doctors use it for simple procedures.   You get a shot in the area the doctor will work on.   You will feel some pressure during the procedure.   You may stay awake. Or you may get medicine to help you relax or sleep.      Regional anesthesia. This type blocks pain to a larger area of the body. It can also help relieve pain right after surgery. And it may reduce your need for other pain medicine after surgery. There are different types. They include:   Peripheral nerve block. This is a shot near a specific nerve or group of nerves. It blocks pain in the part of the body supplied by the nerve. This is often used for procedures on the hands, arms, feet, legs, or face.   Epidural and spinal anesthesia. This is a shot near the spinal cord and the nerves around it. It blocks pain from an entire area of the body, such as the belly, hips, or legs.      General anesthesia. This type affects the brain and the whole body. You may get it through a  small tube placed in a vein (IV). Or you may breathe it in. You are unconscious and will not feel pain. During the surgery, you will be comfortable. Later, you will not remember much about the surgery.     What type will you have?  The type of anesthesia you have depends on many things, such as:   The type of surgery or procedure and the reason you are having it.   Test results, such as blood tests.   How worried you feel about the surgery.   Your health. Your doctor and nurses will ask you about any past surgeries. They will ask about any health problems you may have, such as diabetes, lung or heart disease, or a history of stroke. They will want to know if you take medicine, such as blood thinners. Your doctor may also ask if any family members have had any problems with anesthesia.  You will talk with your anesthesia provider about your options. In many cases, you may be able to choose the type of anesthesia you  have.    What are the risks of anesthesia?    Major side effects are not common. But all types of anesthesia have some risk. Your risk depends on your overall health. It also depends on the type of anesthesia you have and how you respond to it. Serious but rare risks include breathing problems, heart attack, stroke, and reaction to the medicine.    Some health conditions increase the risk of problems. Your anesthesia provider will find out about any health problems you have that may affect your care.  Your anesthesia provider will closely watch your vital signs during anesthesia and surgery. This includes checking your blood pressure and heart rate. This may help you avoid problems from anesthesia.    What can you do to prepare?  You will get a list of instructions to help you prepare. Your doctor will let you know what to expect when you get to the hospital, during the surgery, and after.  You will get instructions about when to stop eating and drinking.  If you take medicine, you will get  instructions about what you can and can't take before surgery.  You will be asked to sign a consent form that says you understand the risks of anesthesia. Before you do, your anesthesia provider will talk with you about the best type for you and the risks and benefits of that type.  Many people are nervous before they have anesthesia and surgery. Ask your doctor about ways to relax before surgery. These may include relaxation exercises or medicine.    What can you expect after having anesthesia?  Right after the surgery, you will be in the recovery room. Nurses will make sure you are comfortable. As the anesthesia wears off, you may feel some pain and discomfort from your surgery.  Tell someone if you have pain. Pain medicine works better if you take it before the pain gets bad.  You may feel some of the effects of anesthesia for several hours.   If you had local or regional anesthesia you may feel numb and have less feeling in part of your body. It may also take a few hours for you to be able to move and control your muscles as usual.   When you first wake up from general anesthesia, you may be confused. Or it may be hard to think clearly. This is normal. It may take some time before the effects of the anesthesia are completely gone.     Other common side effects of anesthesia include:   Nausea and vomiting. This does not usually last long. It can be treated with medicine.   A slight drop in body temperature. You may feel cold and shiver when you first wake up.   A sore throat, if you had general anesthesia.   Muscle aches or weakness.   Feeling tired.  For minor surgeries, you may go home the same day. For other surgeries you may stay in the hospital. Your doctor will check on your recovery from the anesthesia. He or she will answer any questions you may have.    Follow-up care is a key part of your treatment and safety. Be sure to make and go to all appointments, and call your doctor if you are having  problems. It's also a good idea to know your test results and keep a list of the medicines you take.   Where can you learn more?   Go to http://[facility].com/  Enter V817 in the search  box to learn more about "Learning About Anesthesia."    2006-2013 [employer]. Care instructions adapted under license by [facility]. This care instruction is for use with your licensed healthcare professional. If you have questions about a medical condition or this instruction, always ask your healthcare professional. [place] any warranty or liability for your use of this information.  Content Version: 9.7.145117; Last Revised: November 14, 2011

## 2018-09-11 ENCOUNTER — Encounter: Payer: Self-pay | Admitting: Anesthesiology

## 2018-09-11 NOTE — Anesthesia Preprocedure Evaluation (Addendum)
Anesthesia Evaluation    History of Present Illness  [age] yoM with post prostatectomy stress urinary incontinence presents for 4th AUS replacement     PMH: afib on coumadin, HTN. No meds for rate control.  Denies symptoms.    No coumadin for 5 days, has upper front left paraincisor temp cap.  Airway   Mallampati: I     Neck ROM is full.     Dental - no notable dental history.   Teeth problems: temporary cap on front upper incisor     Comment: Upper left paraincisor temporary cap       Pulmonary - negative for pulmonary conditions with ROS and normal exam Cardiovascular   (+) hypertension (),  dysrhythmias (afib),     Rhythm: irregular  Rate: normal  Cardiovascular ROS comment: 10/2013 echo  (LVEF  Mild concentric LVH; type 1 diastolic dysfunction; dilated left atrium 5.5cm; aortic root not dilated;  Right sided dilation;  Diminished RV systolic function; Mild myxomatous degeneration/calcification of mitral valve; Moderate TR/MR; mild PI;  Small LV outflow tract without obstruction; PASP 32mmhg     02/2015 stress sestamibi (10.4 METS,  105% MPHR; LVEF 64%; LVEDV 53; normal wall motion; no significant reversible ischemia)   Patient has good exercise tolerance.   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  Patient did not smoke on day of procedure.  Education provided regarding risk of obstructive sleep apnea.  Intravenous induction    Postoperative administration of opioids is  intended.  Trial extubation is planned.    Anesthetic plan and risks discussed with patient.  Resident/Fellow/CRNA discussed the plan with the attending.  I personally performed a physical assessment on this patient.

## 2018-09-11 NOTE — Op Note (Addendum)
OPERATIVE NOTE   Urologic Surgery Service  09/12/2018 22:15    Date of Service: 09/12/2018    Pre-Op Diagnosis:  Post RALP SUI     Post-Op Diagnosis:  Same     Procedure Performed/Description:  Procedure(s):  Removal and tandem cuff and pump and tubing of artifical sphincter and placement of artifical sphincter    Name of Surgeon and Assistants:    Surgeon(s) and Role:     * Dina Mobley, Raelene Bott, MD - Primary     * Valerie Roys, MD    Type of Anesthesia:  GETA    DVT Prophylaxis:  Bilateral SCDs, sub q heparin    Findings:  Healthy bulbar urethra - new 4.5 cm cuff was placed here, prior AUS double cuffs connected to the pump via a Y connector without signs of erosion along penile urethra; no fluid seen within system.  Pump, all tubing, and double cuffs removed, reservoir was NOT removed due to mesh and hernia repair at site of PRB on the right.  New pump placed on the left.      Specimens Removed:   Pump, two AUS cuffs, Y connector and all tubing and connectors were removed.     EBL:  10cc    Drains: 12Fr foley catheter    Fluids:  Please see anesthesia note     Complications:  None apparent    Outcome: Stable to PACU    Indications for Procedure:   The patient expressed understanding of the risks and benefits of the procedure, which include but are not limited to pain, bleeding, infection, injury to urethra and corpora and need for re-intervention.    Description of Procedure in Detail:   The patient was identified in the pre op area. Site was marked and consent was confirmed. He was brought to the operating room and placed in low lithotomy position. A timeout was completed. Anesthesia was induced and the patient was intubated without any apparent complications. The abdomen and perineal region was then prepped and draped in sterile fashion.    He was medicated with vancomycin and gentamycin and brought to the operating room where general anesthesia was introduced. He was then transferred to the  well-padded lithotomy position where he was shaved with clippers, and then underwent a wide extended Chlorhexadine skin prep. There were no breaks in sterile technique during the operation. Once he was very carefully draped,  the groin, genitalia, fossa navicularis, perineum, scrotum, and lower drapes were cleaned with half strength Betadine. As the 12 french Foley catheter was passed into the fossa navicularis, both the fossa and the catheter were cleaned with Betadine solution. This continued until the catheter passed into the bladder. This drained clear urine. The balloon was inflated and then the catheter and fossa navicularis were aggressively sprayed with Betadine as the balloon was snugged to the bladder neck.     A 5-cm incision along the midline raphe over the bulbar urethra was created with scalpel and carried down through the subcutaneous tissues with electrocautery. Once the bulbar urethra had been identified,the [place] ring retractor was used to retract the edges of the incision to facilitate exposure.  The bulbospongiosus muscle was divided at midline with cautery and reflected off the bulbous urethra. Extreme care was taken not to injure or damage corpora tissue. At this time, we were able to visualize the first of the two double cuff. We carefully dissected it away from the nearby bulbar tissue by using bovie cautery. Then, the cuff was  unwrapped without resistance and removed atraumatically. The tubing was left intact. We then carefully followed the device distally to identify a Y tubing connector. Further careful dissection revealed the second double cuff distal to the first cuff. Again, it was unwrapped and removed atraumatically after dissecting it free from the bulbar tissue. We also identified the pump and it was carefully dissected away using Metsbaum and bovie cautery. At this time, the bulbar urethra was completed exposed and freed of any device. Next, we turned our attention to his  reservoir which was documented to be on his right side. By palpating the tubing connected to the reservoir, we were able to confirm that it entered his right inguinal canal since prior surgery was done via a penoscrotal incision.  At this time, we decided not to proceed further because it would involve opening his inguinal canal and exposing him to risks of having recurrent hernia. The reservoir tubing was dissected as proximaly as possible and it was cut with a pair of heavy scissors and the proximal part of the tubing expectedly retracted back into this abdomen. We then proceeded with further dissection using Metzenbaums on the external portion of the bulbospongiosus muscle between the corpora and the urethra.The urethra was encircled without difficulty or injury and a right-angled clamp was passed. A penrose drain was passed behind the urethra and the dissection continued posteriorly until an area large enough for the AUS cuff was created. A measuring device was placed and the urethral and bulbospongiosus muscle measured to and a 4.5cm cuff was chosen. Measurement was verified twice. This was prepared on the back table along with pressure regulating 61-70 cm/H2O balloon and the scrotal pump.   A 4-cm inguinal incision was created two fingerbreadths above the symphysis pubis along the LEFT groin. Of note, 10 mL of 0.25% Marcaine was placed along the marked incision site prior to the actual incision.  This was carried down through subcutaneous tissues with electrocautery until fascia was identified. The fascia was opened with electrocautery and muscle identified. The rectus was gently opened with Tonsil clamp until the space of Retzius was identified and this space was finger dissected to accept the pressure-regulating balloon. The scrotal pouch was then everted up into this wound and cleaned off without any evidence of bleeding. A 10 x 10 drape was placed and the pressure- regulating balloon was placed within the  pocket. The fascia was closed with #1 Vicryl on CT2 needle and the pressure-regulating balloon inflated with 24 mL of fluid and a rubber shod clamp was applied to tubing. The cuff was then placed around the urethra and carried up with Tonsil clamp into the suprapubic incision. The cuff appeared to be snug, not too loose and not too tight. Likewise, the scrotal pump was placed and the connections were then all created with a flow connect device. The device was cycled several times and noted to cycle without difficulty and was then locked into an opened position. The wounds were closed in multiple layers of 3-0 Vicryl. The abdominal skin incision was closed with 4-0 monocryl in a running subcuticular fashion.The perineal wound was closed with 2-0 Vicryl in a running vertical mattress fashion. Dermabond was applied to all wounds. The device was confirmed to be deactivated.   All sponge and needle counts were correct. The patient tolerated the procedure well, was extubated in the operating room, and was taken to the recovery room in stable condition.  Instrument and sponge count was correct at the close of  the procedure. Dr. Nanda Quinton was present for the entire case. The patient was extubated and transported in stable position to the PACU at the close of the case.     Plan:   Admit to Urology   Remove foley catheter tomorrow  Bactrim BID for one week since patient had multiple GU/perineal operations and this was a complicated removal and replacement.      Valerie Roys, MD   Urologic Surgery, URO 2  PI# [id number]  Personal pager: [phone]  Urologic Surgery Service Pager: [phone]    Modifier 22 -- Increased Procedural Services: the work required was substantially greater than typically required. The procedure required additional work due to increased intensity, time, and technical difficulty of the procedure.  This was due to the severity of patient's condition which required significant physical and mental effort.  This required  expertise - hx of right inguinal hernia repair with mesh, prior penoscrotal incision and tandem cuff and three prior surgeries of AUS device with modifications.    I was physically present and surgically scrubbed for the entire procedure and participated in the entire procedure.  Griselda Miner, MD, MPH  Assistant Professor  Department of Urology  Male Pelvic Medicine and Reconstructive Surgery  [facility] [phone]  09/12/2018 17:50

## 2018-09-12 ENCOUNTER — Other Ambulatory Visit: Payer: Self-pay

## 2018-09-12 ENCOUNTER — Ambulatory Visit
Admission: RE | Admit: 2018-09-12 | Discharge: 2018-09-13 | Disposition: A | Discharge status: Home / Self Care | Payer: Medicare Other | Attending: Urology | Admitting: Urology

## 2018-09-12 ENCOUNTER — Encounter
Admission: RE | Disposition: A | Discharge status: Home / Self Care | Payer: Self-pay | Source: Ambulatory Visit | Attending: Urology

## 2018-09-12 ENCOUNTER — Inpatient Hospital Stay (HOSPITAL_BASED_OUTPATIENT_CLINIC_OR_DEPARTMENT_OTHER): Payer: Medicare Other | Admitting: Student in an Organized Health Care Education/Training Program

## 2018-09-12 ENCOUNTER — Inpatient Hospital Stay: Payer: Medicare Other | Admitting: Student in an Organized Health Care Education/Training Program

## 2018-09-12 DIAGNOSIS — Z9689 Presence of other specified functional implants: Secondary | ICD-10-CM

## 2018-09-12 DIAGNOSIS — Z7901 Long term (current) use of anticoagulants: Secondary | ICD-10-CM | POA: Insufficient documentation

## 2018-09-12 DIAGNOSIS — N393 Stress incontinence (female) (male): Secondary | ICD-10-CM

## 2018-09-12 DIAGNOSIS — I482 Chronic atrial fibrillation, unspecified: Secondary | ICD-10-CM | POA: Insufficient documentation

## 2018-09-12 DIAGNOSIS — Z466 Encounter for fitting and adjustment of urinary device: Secondary | ICD-10-CM

## 2018-09-12 DIAGNOSIS — Z9079 Acquired absence of other genital organ(s): Secondary | ICD-10-CM | POA: Insufficient documentation

## 2018-09-12 DIAGNOSIS — T83191A Other mechanical complication of urinary sphincter implant, initial encounter: Secondary | ICD-10-CM

## 2018-09-12 DIAGNOSIS — Z8546 Personal history of malignant neoplasm of prostate: Secondary | ICD-10-CM | POA: Insufficient documentation

## 2018-09-12 DIAGNOSIS — R32 Unspecified urinary incontinence: Secondary | ICD-10-CM

## 2018-09-12 DIAGNOSIS — I10 Essential (primary) hypertension: Secondary | ICD-10-CM | POA: Insufficient documentation

## 2018-09-12 LAB — ELECTROCARDIOGRAM WITH RHYTHM STRIP: QTC: 452

## 2018-09-12 LAB — INR
INR: 1.16 (ref 0.87–1.18)
PROTHROMBIN TIME: 10.6 s (ref 8.4–10.7)

## 2018-09-12 SURGERY — REVISION, ARTIFICIAL URINARY SPHINCTER
Anesthesia: General | Site: Perineum | Wound class: Clean

## 2018-09-12 MED ORDER — KETOROLAC 30 MG/ML (1 ML) INJECTION SOLUTION
INTRAMUSCULAR | Status: DC | PRN
Start: 2018-09-12 — End: 2018-09-12
  Administered 2018-09-12: 30 mg via INTRAVENOUS

## 2018-09-12 MED ORDER — LIDOCAINE HCL 20 MG/ML (2 %) INJECTION SOLUTION
INTRAMUSCULAR | Status: DC | PRN
Start: 2018-09-12 — End: 2018-09-12
  Administered 2018-09-12: 4 mL via INTRAVENOUS

## 2018-09-12 MED ORDER — GENTAMICIN 120 MG/100 ML IN SODIUM CHLORIDE(ISO) INTRAVENOUS PIGGYBACK
1.5000 mg/kg | INJECTION | INTRAVENOUS | Status: AC
Start: 2018-09-12 — End: 2018-09-12
  Administered 2018-09-12: 120 mg via INTRAVENOUS
  Filled 2018-09-12: qty 100, fill #0

## 2018-09-12 MED ORDER — INTRAOP BUPIVACAINE PF 0.25% INJ 30 ML VIAL ADDITIVE
Status: DC | PRN
Start: 2018-09-12 — End: 2018-09-12
  Administered 2018-09-12: 10 mL

## 2018-09-12 MED ORDER — MIDAZOLAM (PF) 1 MG/ML INJECTION SOLUTION
INTRAMUSCULAR | Status: DC | PRN
Start: 2018-09-12 — End: 2018-09-12
  Administered 2018-09-12: 1 mg via INTRAVENOUS

## 2018-09-12 MED ORDER — FENTANYL (PF) 50 MCG/ML INJECTION SOLUTION
INTRAMUSCULAR | Status: AC
Start: 2018-09-12 — End: 2018-09-12
  Filled 2018-09-12: qty 5, fill #0

## 2018-09-12 MED ORDER — PROPOFOL 10 MG/ML INTRAVENOUS EMULSION
INTRAVENOUS | Status: AC
Start: 2018-09-12 — End: 2018-09-12
  Filled 2018-09-12: qty 40, fill #0

## 2018-09-12 MED ORDER — LIDOCAINE HCL 10 MG/ML (1 %) INJECTION SOLUTION
0.1000 mL | INTRAMUSCULAR | Status: DC | PRN
Start: 2018-09-12 — End: 2018-09-12
  Administered 2018-09-12: 0.15 mL

## 2018-09-12 MED ORDER — FENTANYL (PF) 50 MCG/ML INJECTION SOLUTION
25.0000 ug | INTRAMUSCULAR | Status: DC | PRN
Start: 2018-09-12 — End: 2018-09-13

## 2018-09-12 MED ORDER — BUPIVACAINE (PF) 0.25 % (2.5 MG/ML) INJECTION SOLUTION
INTRAMUSCULAR | Status: AC
Start: 2018-09-12 — End: 2018-09-12
  Filled 2018-09-12: qty 30, fill #0

## 2018-09-12 MED ORDER — CALCIUM 200 MG (AS CALCIUM CARBONATE 500 MG) CHEWABLE TABLET
750.0000 mg | CHEWABLE_TABLET | Freq: Two times a day (BID) | ORAL | Status: DC | PRN
Start: 2018-09-12 — End: 2018-09-13

## 2018-09-12 MED ORDER — HEPARIN (PORCINE) 5,000 UNIT/ML SOLUTION FOR INJECTION
5000.0000 [IU] | Freq: Three times a day (TID) | INTRAMUSCULAR | Status: DC
Start: 2018-09-12 — End: 2018-09-13
  Administered 2018-09-12 – 2018-09-13 (×3): 5000 [IU] via SUBCUTANEOUS
  Filled 2018-09-12 (×3): qty 1, fill #0

## 2018-09-12 MED ORDER — HEPARIN (PORCINE) 5,000 UNIT/ML SOLUTION FOR INJECTION
5000.0000 [IU] | Freq: Three times a day (TID) | INTRAMUSCULAR | Status: DC
Start: 2018-09-12 — End: 2018-09-12
  Administered 2018-09-12: 5000 [IU] via SUBCUTANEOUS

## 2018-09-12 MED ORDER — SULFAMETHOXAZOLE 800 MG-TRIMETHOPRIM 160 MG TABLET
1.0000 | ORAL_TABLET | Freq: Two times a day (BID) | ORAL | 0 refills | Status: AC
Start: 2018-09-12 — End: 2018-09-20
  Filled 2018-09-12: qty 14, 7d supply, fill #0

## 2018-09-12 MED ORDER — HYDROMORPHONE 2 MG/ML INJECTION SOLUTION
INTRAMUSCULAR | Status: DC | PRN
Start: 2018-09-12 — End: 2018-09-12
  Administered 2018-09-12: 0.4 mg via INTRAVENOUS
  Administered 2018-09-12: .8 mg via INTRAVENOUS

## 2018-09-12 MED ORDER — INTRAOP NACL 0.9% 1000 ML IRRIGATION BOTTLE
Status: DC | PRN
Start: 2018-09-12 — End: 2018-09-12
  Administered 2018-09-12 (×2): 1000 mL

## 2018-09-12 MED ORDER — INTRAOP ROCURONIUM 10 MG/ML INJ 5 ML VIAL
Status: DC | PRN
Start: 2018-09-12 — End: 2018-09-12
  Administered 2018-09-12: 20 mg via INTRAVENOUS
  Administered 2018-09-12: 10 mg via INTRAVENOUS
  Administered 2018-09-12: 50 mg via INTRAVENOUS

## 2018-09-12 MED ORDER — ACETAMINOPHEN 1,000 MG/100 ML (10 MG/ML) INTRAVENOUS SOLUTION
INTRAVENOUS | Status: DC | PRN
Start: 2018-09-12 — End: 2018-09-12
  Administered 2018-09-12: 1000 mg via INTRAVENOUS

## 2018-09-12 MED ORDER — ONDANSETRON HCL (PF) 4 MG/2 ML INJECTION SOLUTION
4.0000 mg | INTRAMUSCULAR | Status: DC | PRN
Start: 2018-09-12 — End: 2018-09-13

## 2018-09-12 MED ORDER — NALOXONE 4 MG/ACTUATION NASAL SPRAY
NASAL | 0 refills | Status: DC
Start: 2018-09-12 — End: 2018-09-12
  Filled 2018-09-12: qty 2, fill #0

## 2018-09-12 MED ORDER — ACETAMINOPHEN 1,000 MG/100 ML (10 MG/ML) INTRAVENOUS SOLUTION
INTRAVENOUS | Status: AC
Start: 2018-09-12 — End: 2018-09-12
  Filled 2018-09-12: qty 100, fill #0

## 2018-09-12 MED ORDER — INTRAOP PROPOFOL INJ 20 ML VIAL (BOLUS+INFUSION)
Status: DC | PRN
Start: 2018-09-12 — End: 2018-09-12
  Administered 2018-09-12: 200 mg via INTRAVENOUS

## 2018-09-12 MED ORDER — HYDROMORPHONE 1 MG/ML INJECTION SYRINGE
0.2000 mg | INJECTION | INTRAMUSCULAR | Status: DC | PRN
Start: 2018-09-12 — End: 2018-09-13
  Administered 2018-09-12: 0.4 mg via INTRAVENOUS
  Administered 2018-09-12: 0.2 mg via INTRAVENOUS
  Administered 2018-09-12: 0.4 mg via INTRAVENOUS
  Filled 2018-09-12: qty 1, fill #0

## 2018-09-12 MED ORDER — OXYCODONE 5 MG TABLET
5.0000 mg | ORAL_TABLET | ORAL | Status: DC | PRN
Start: 2018-09-12 — End: 2018-09-13
  Administered 2018-09-13: 10 mg via ORAL
  Filled 2018-09-12: qty 2, fill #0

## 2018-09-12 MED ORDER — PHENOL 1.4 % MUCOSAL AEROSOL SPRAY
2.0000 | INHALATION_SPRAY | Status: DC | PRN
Start: 2018-09-12 — End: 2018-09-13

## 2018-09-12 MED ORDER — LACTATED RINGERS IV INFUSION
INTRAVENOUS | Status: DC
Start: 2018-09-12 — End: 2018-09-12
  Administered 2018-09-12 (×2): via INTRAVENOUS

## 2018-09-12 MED ORDER — OXYBUTYNIN CHLORIDE 5 MG TABLET
5.0000 mg | ORAL_TABLET | Freq: Three times a day (TID) | ORAL | 0 refills | Status: DC | PRN
Start: 2018-09-12 — End: 2018-09-12
  Filled 2018-09-12: qty 15, 5d supply, fill #0

## 2018-09-12 MED ORDER — GENTAMICIN 120 MG/100 ML IN SODIUM CHLORIDE(ISO) INTRAVENOUS PIGGYBACK
1.5000 mg/kg | INJECTION | Freq: Three times a day (TID) | INTRAVENOUS | Status: DC
Start: 2018-09-12 — End: 2018-09-13
  Administered 2018-09-12 – 2018-09-13 (×2): 120 mg via INTRAVENOUS
  Filled 2018-09-12 (×3): qty 100, fill #0

## 2018-09-12 MED ORDER — OXYCODONE 5 MG TABLET
5.0000 mg | ORAL_TABLET | ORAL | 0 refills | Status: DC | PRN
Start: 2018-09-12 — End: 2018-09-12
  Filled 2018-09-12: qty 20, 4d supply, fill #0

## 2018-09-12 MED ORDER — HEPARIN, PORCINE (PF) 5,000 UNIT/0.5 ML INJECTION SYRINGE
INJECTION | INTRAMUSCULAR | Status: AC
Start: 2018-09-12 — End: 2018-09-12
  Filled 2018-09-12: qty 1, fill #0

## 2018-09-12 MED ORDER — HYDROMORPHONE 1 MG/ML INJECTION SYRINGE
0.4000 mg | INJECTION | INTRAMUSCULAR | Status: DC | PRN
Start: 2018-09-12 — End: 2018-09-13

## 2018-09-12 MED ORDER — HYDROMORPHONE 2 MG/ML INJECTION SOLUTION
INTRAMUSCULAR | Status: AC
Start: 2018-09-12 — End: 2018-09-12
  Filled 2018-09-12: qty 1, fill #0

## 2018-09-12 MED ORDER — OXYBUTYNIN CHLORIDE ER 5 MG TABLET,EXTENDED RELEASE 24 HR
15.0000 mg | EXTENDED_RELEASE_CAPSULE | Freq: Every day | ORAL | Status: DC
Start: 2018-09-12 — End: 2018-09-13
  Administered 2018-09-12: 15 mg via ORAL
  Filled 2018-09-12 (×2): qty 3, fill #0

## 2018-09-12 MED ORDER — INTRAOP FENTANYL 50 MCG/ML INJ 5 ML AMP
Status: DC | PRN
Start: 2018-09-12 — End: 2018-09-12
  Administered 2018-09-12: 100 ug via INTRAVENOUS

## 2018-09-12 MED ORDER — PHENYLEPHRINE 1 MG/10 ML (100 MCG/ML) IN 0.9 % SOD.CHLORIDE IV SYRINGE
INJECTION | INTRAVENOUS | Status: DC | PRN
Start: 2018-09-12 — End: 2018-09-12
  Administered 2018-09-12 (×3): 50 ug via INTRAVENOUS

## 2018-09-12 MED ORDER — VANCOMYCIN 1 GRAM/200 ML IN DEXTROSE 5 % INTRAVENOUS PIGGYBACK
1000.0000 mg | INJECTION | Freq: Two times a day (BID) | INTRAVENOUS | Status: DC
Start: 2018-09-12 — End: 2018-09-13
  Administered 2018-09-12 – 2018-09-13 (×2): 1000 mg via INTRAVENOUS
  Filled 2018-09-12 (×2): qty 200, fill #0

## 2018-09-12 MED ORDER — VANCOMYCIN 1 GRAM/200 ML IN DEXTROSE 5 % INTRAVENOUS PIGGYBACK
1000.0000 mg | INJECTION | INTRAVENOUS | Status: AC
Start: 2018-09-12 — End: 2018-09-12
  Administered 2018-09-12: 1000 mg via INTRAVENOUS
  Filled 2018-09-12: qty 200, fill #0

## 2018-09-12 MED ORDER — ONDANSETRON HCL (PF) 4 MG/2 ML INJECTION SOLUTION
4.0000 mg | Freq: Three times a day (TID) | INTRAMUSCULAR | Status: DC | PRN
Start: 2018-09-12 — End: 2018-09-13

## 2018-09-12 MED ORDER — BACITRACIN 50,000 UNIT INTRAMUSCULAR SOLUTION
INTRAMUSCULAR | Status: AC
Start: 2018-09-12 — End: 2018-09-12
  Filled 2018-09-12: qty 100000, fill #0

## 2018-09-12 MED ORDER — ONDANSETRON HCL (PF) 4 MG/2 ML INJECTION SOLUTION
INTRAMUSCULAR | Status: DC | PRN
Start: 2018-09-12 — End: 2018-09-12
  Administered 2018-09-12: 4 mg via INTRAVENOUS

## 2018-09-12 MED ORDER — MIDAZOLAM (PF) 1 MG/ML INJECTION SOLUTION
INTRAMUSCULAR | Status: AC
Start: 2018-09-12 — End: 2018-09-12
  Filled 2018-09-12: qty 2, fill #0

## 2018-09-12 SURGICAL SUPPLY — 43 items
AMS PENILE 800 WITH INHIBIZONE ACCESSORY 720066-01 (Misc-Urology) ×2 IMPLANT
AMS PRESSURE REG. BALLOON 61-70CM 72400024 (Misc-Urology) ×2 IMPLANT
AMS URINARY CONTROL SYSTEM PUMP WITH INHIBIZONE 72404127 (Misc-Urology) ×2 IMPLANT
AMS URINARY CONTROL SYSTEM S.T 4.5 CM 72404131 (Misc-Urology) ×2 IMPLANT
BOWL SMALL STERILE (Other) ×2 IMPLANT
CATHETER FOLEY 12FR 5ML SILICONE (Catheter) ×4 IMPLANT
CAUTERY BOVIE PAD ADULT (Other) ×2 IMPLANT
COVER LIGHT HANDLE STERIS (Other) ×6 IMPLANT
DISC USE 152549 - PREP CHLORAPREP 26ML WITH TINT ~~LOC~~ (Prep) ×2 IMPLANT
DISC USE 153910 - DRAIN PENROSE 3/8 X 18 IN SILICONE (Drain) ×2 IMPLANT
DRAINAGE METERED BAG 350ML URINE LF (Drain) IMPLANT
DRAPE BACK TABLE COVER 44 X 88IN (Drape) ×2 IMPLANT
DRAPE LAPAROSCOPY / PELVISCOPY (Drape) ×2 IMPLANT
DRAPE STERI 17 X 11 3M 1000 (Drape) ×2 IMPLANT
DRAPE TOWEL CLOTH 17 X 27IN STERILE BLUE 4 PACK (Drape) ×2 IMPLANT
DRESSING DERMABOND ADVANCED TISSUE ADHESIVE (Dressing) ×2 IMPLANT
DRESSING GAUZE DUSOFT 2 X 2IN (Dressing) ×2 IMPLANT
DRESSING TEGADERM 4 X 4.5IN MEDIUM (Dressing) ×2 IMPLANT
GLOVES BIOGEL 6 1/2 TOP GLOVE LATEX (Glove) ×2 IMPLANT
GLOVES BIOGEL SKINSENSE  INDICATOR 6 1/2 BLUE UNDERGLOVE LATEX FREE RED PACKAGE (Glove) ×2 IMPLANT
GOWN LARGE AERO BLUE AAMI 3 STANDARD (Gown) ×2 IMPLANT
HEADREST DONUT 9IN (M10-090) (Other) ×2 IMPLANT
KY JELLY LUBRICATING 4OZ (8919) (Other) ×2 IMPLANT
PACK BASIN LATEX SAFE (DYNJ0191310Q) (Pack) ×2 IMPLANT
PREP BETADINE PAINT 4OZ (Prep) ×2 IMPLANT
PREP CLEANSING M CARE MEATAL (Other) ×2 IMPLANT
RETRACTOR LONE STAR DISP RING 32.5 X 18.3CM (Other) ×2 IMPLANT
RETRACTOR LONE STAR HOOK BLUNT 12MM YELLOW (Other) ×2 IMPLANT
SCD SLEEVE CALF REG 18IN ALP 1 (Other) ×2 IMPLANT
SPONGE GAUZE 4 X 4IN 12PLY STERILE 10 PACK (Dressing) ×2 IMPLANT
SPRAY APPLICATION KIT (70017) (Other) ×2 IMPLANT
SUTURE BOOTS YELLOW (Other) ×2 IMPLANT
SUTURE MONOCRYL 4-0 PS-2 27IN UNDYED (Suture) ×4 IMPLANT
SUTURE SILK 2-0 FS 18IN BLACK (Suture) ×2 IMPLANT
SUTURE VICRYL 1 CT-2 27IN VIOLET (Suture) ×4 IMPLANT
SUTURE VICRYL 2-0 SH 27IN UNDYED (Suture) ×8 IMPLANT
SUTURE VICRYL 2-0 SH 27IN VIOLET (Suture) ×2 IMPLANT
SUTURE VICRYL 3-0 SH 27IN UNDYED (Suture) ×24 IMPLANT
SUTURE VICRYL 4-0 SH 27IN UNDYED (Suture) ×2 IMPLANT
SYRINGE 35ML LUER LOCK (Syringe) ×4 IMPLANT
SYRINGE SAFETY LL 12ML (8881522000) (Syringe) ×2 IMPLANT
TRAY INSERTION WITHOUT FOLEY CATHETER (Catheter) ×2 IMPLANT
TRAY SKIN SCRUB PVP W/SPONGE (41591) (Prep) ×2 IMPLANT

## 2018-09-12 NOTE — Nurse Transfer Note (Signed)
Pt to [facility] bed 62; report to [place], Charity fundraiserN.  All belongings w/ pt's spouse.

## 2018-09-12 NOTE — Anesthesia Procedure Notes (Signed)
Airway  Date/Time: 09/12/2018 10:01 AM    Staff    Patient location:  [facility] OR  Charles Decker, Charles Jean, MD.  Performing Resident/CRNA: John GiovanniNellessen, Jameison Haji, MD        Indications and Patient Condition    Spontaneous Ventilation: absent  Sedation level: level 0: deep/analgesia  Preoxygenated: yes  Patient position: sniffing  Mask difficulty assessment: 1 - vent by mask    Final Airway Details    Final airway type: endotracheal airway      Successful airway: cuffed ETT    Successful intubation technique: direct laryngoscopy  Facilitating devices/methods: intubating stylet  Endotracheal tube insertion site: oral  Blade: Macint[place]  Blade size: #4  ETT size: 7.5 mm  Cormack-Lehane Classification: grade IIa - partial view of glottis  Placement verified by: chest auscultation   Measured from: teeth  Secured at 21 cm.  Number of attempts at approach: 1

## 2018-09-12 NOTE — Anesthesia Postprocedure Evaluation (Addendum)
I concur with the resident's management, and approve discharge. The patient meets PACU discharge criteria. Roosvelt HarpsNorma J. Guerline Happ, MD  Attending Anesthesiologist [phone]    Patient: Charles ConnorsWilliam Gravelle    REVISION, ARTIFICIAL URINARY SPHINCTER    Anesthesia Type: general    Stop Bang: 3    Vital Signs (Last Recorded):  BP: 132/86 (09/12/18 1400)  Pulse: 81 (09/12/18 1400)  Resp: 15 (09/12/18 1345)  Temp Max: 37 C (98.6 F)  (Last 24 hours)  Temp: 36.8 C (98.2 F) (09/12/18 1400)  SpO2: 97 % (09/12/18 1400) on    Device (Oxygen Therapy): room air (09/12/18 1400)      Anesthesia Post Evaluation    Procedure: Procedure(s):REVISION, ARTIFICIAL URINARY SPHINCTER  [facility]: [facility]  Anesthesia: General    Patient location during evaluation: patient's room  Patient participation: complete - patient participated  Level of consciousness: awake and alert  Pain score: 0  Pain management: adequate  Airway patency: patent  Anesthetic complications: no  Cardiovascular status: blood pressure returned to baseline and stable  Respiratory status: room air  Hydration status: euvolemic  Nausea and Vomiting: absent  Comments: Patient with adequate pain control after receiving 1 mg of hydromorphone. Tolerating water. Denies nausea. Ready for transfer to [facility].     Reyne Dumasyan A Jones, MD          Reyne Dumasyan A Jones, MD

## 2018-09-12 NOTE — OR Nursing (Signed)
OR To Postop Destination    Patient's level of consciousness: drowsy but responsive    Patient transferred to: PACU  Transported via: gurney  Transported by: anesthesia person, circulator and surgical resident  Head of bed: elevated  Oxygen delivered via: face mask  Monitored enroute: no    Report given to: PACU RN

## 2018-09-12 NOTE — Nurse Assessment (Signed)
PACU ADMIT NURSING NOTE    Note Started: 09/12/2018, 13:37     Received patient from OR at 1310 hours via gurney.  Monitor and Alarms on.  Patient sleepy but arousable. Ephriam Knuckleseanna [place], RN

## 2018-09-12 NOTE — Nurse Focus (Signed)
PREOP ADMIT NURSING NOTE    Note Started: 09/12/2018, 08:53     Received patient from as a direct admit at 0822 hours via ambulation.  Patient status  awake and alert.  Oriented to room and unit.  Admission assessment and care plan initiated.   PIN number cards provided to patient.   Drucie OpitzLini Kelin Borum, RN

## 2018-09-12 NOTE — Progress Notes (Addendum)
Urologic Surgery Progress Note  Today's Date:  09/12/2018    LOS: 1 day     Identification: 54yr male with Afib (on coumadin) s/p RALP in 2008 who subsequently developed SUI.    Procedure  09/12/18  Dr. Nanda Quinton   REVISION, ARTIFICIAL URINARY SPHINCTER    Subjective / 24 hour events  AF, VSS  Pain adequately controlled   Eating dinner, no nausea   Abdomen and perineal wound covered with dermabond   Foley with clear urine     Objective:   Range of Vitals  Temp src: Core (12/11 1400)  Temp:  [36.2 C (97.2 F)-37 C (98.6 F)]   Pulse:  [68-87]   BP: (111-147)/(86-103)   Resp:  [7-16]   SpO2:  [97 %-100 %]     Current Vitals  Temp: 36.8 C (98.2 F) (12/11 1400)  Temp src: Core (12/11 1400)  Pulse: 81 (12/11 1400)  BP: 132/86 (12/11 1400)  Resp: 15 (12/11 1345)  SpO2: 97 % (12/11 1400)  Height: 185.4 cm (6\' 1" ) (12/11 0838)  Weight: 87.2 kg (192 lb 3.9 oz) (12/11 0838)    Intake/Output  No intake/output data recorded.    Hospital Medications  Drips     Scheduled   Gentamicin 120 mg in NaCl (iso-osm) 100 mL IVPB, IV, Q8H Now  Heparin 5000 units/mL Injection 5,000 Units, SUBCUTANEOUS, Q8H (16,10,96)  Oxybutynin (DITROPAN XL) SR Tablet 15 mg, ORAL, QAM  Vancomycin (VANCOCIN) 1,000 mg in Dextrose (iso-osm) 200 mL IVPB, IV, Q12H Now      PRN  Calcium Carbonate (TUMS) Chewable Tablet 750 mg, ORAL, Q12H PRN  Fentanyl (SUBLIMAZE) Injection 25 mcg, IV, Q5MIN PRN  Hydromorphone (DILAUDID) Injection 0.2-0.4 mg, IV, Q15MIN PRN  Hydromorphone (DILAUDID) Injection 0.4 mg, IV, Q4H PRN  Ondansetron (ZOFRAN) Injection 4 mg, IV, PRN X 1  Oxycodone (ROXICODONE) Tablet 5-10 mg, ORAL, Q4H PRN  Phenol (CHLORASEPTIC) 1.4 % Throat Spray 2 spray, Mouth/Throat, Q2H PRN        Home Medications  Lisinopril (PRINIVIL, ZESTRIL) 40 mg Tablet, Take 40 mg by mouth every day at bedtime.  multivitamin with folic acid (THERA) 400 mcg Tablet, Take 1 tablet by mouth every morning.  Pravastatin (PRAVACHOL) 40 mg Tablet, Take 40 mg by mouth every day at  bedtime.  Warfarin (COUMADIN) 7.5 mg Tablet, Take 7.5 mg by mouth every day at bedtime.        EXAM  Physical Exam  Gen:  Alert, communicative  Resp: Easy work of breathing on room air  Abdomen: Soft, appropriately tender, LLQ incision covered with dermabond   GU: Foley in place with clear urine, perineal incision covered with dermabond  Ext:  Moving all extremities       LABS/Imaging:  Recent labs for the past 72 hours     09/10/18 1010    WHITE BLOOD CELL COUNT 5.5    HEMOGLOBIN 15.1    HEMATOCRIT 45.2    PLATELET COUNT 158      Recent labs for the past 72 hours     09/12/18 0845    APTT --    INR 1.16     Recent labs for the past 72 hours     09/10/18 1010    SODIUM 140    POTASSIUM 4.2    CHLORIDE 104    CARBON DIOXIDE TOTAL 27    UREA NITROGEN, BLOOD (BUN) 15    CREATININE BLOOD 1.05    GLUCOSE 110*      Recent labs  for the past 72 hours     09/10/18 1010    CALCIUM 9.5    CALCIUM ION WHOLE BLOOD --    MAGNESIUM (MG) --    PHOSPHORUS (PO4) --        Assessment  [age]yr male POD 0 s/p AUS revision.     Plan:   Neuro: tylenol, Norco 5, IV dilaudid  Cardiovascular: afib (currently holding home coumadin)  Pulmonary: Pulmonary hygiene   FEN/GI: regular diet   GU: Foley 12 Fr -- draining clear urine   ID/Heme: complete peri op abx (vanc and gent)  Endocrine: no issues   PPx: SCDs, heparin tid     Dispo: Pending    Electronically Signed on 09/12/2018 at 17:41 by:  Valerie RoysPhillip Kim, MD   Urologic Surgery, URO 2  PI# [phone]  Personal pager: [phone]  Urologic Surgery Service Pager: [phone]    I saw and evaluated the patient with the resident. Together, we reviewed all pertinent images, labs, and reports for this patient and developed a care plan.  I agree with the resident's documentation of the care plan.    Griselda MinerJennifer Trayquan Kolakowski, MD, MPH  Assistant Professor  Department of Urology  Male Pelvic Medicine and Reconstructive Surgery  [facility]  Clinic [phone]

## 2018-09-12 NOTE — OR Nursing (Signed)
OR Arrive    Patient's level of consciousness: awake and comfortable    Summary report reviewed: yes    Patient transported to OR via: gurney  Transported by: anesthesia person  Head of bed: elevated  Oxygen delivered via: N/A  Monitored enroute: no  Assumed care.

## 2018-09-13 ENCOUNTER — Other Ambulatory Visit: Payer: Self-pay

## 2018-09-13 MED ORDER — OXYCODONE 5 MG TABLET
5.0000 mg | ORAL_TABLET | ORAL | 0 refills | Status: AC | PRN
Start: 2018-09-13 — End: 2018-09-17
  Filled 2018-09-13: qty 20, 4d supply, fill #0

## 2018-09-13 MED ORDER — DOCUSATE SODIUM 100 MG CAPSULE
100.0000 mg | ORAL_CAPSULE | Freq: Two times a day (BID) | ORAL | 0 refills | Status: AC
Start: 2018-09-13 — End: 2018-10-13
  Filled 2018-09-13: qty 60, 30d supply, fill #0

## 2018-09-13 NOTE — Progress Notes (Addendum)
Urologic Surgery Progress Note  Today's Date:  09/13/2018    LOS: 1 day     Identification: 59yr male with Afib (on coumadin) s/p RALP in 2008 who subsequently developed SUI.    Procedure  09/12/18  Dr. Nanda Quinton   REVISION, ARTIFICIAL URINARY SPHINCTER    Subjective / 24 hour events  POD 1   AF, VSS  Pain adequately controlled   Abdomen and perineal wound covered with dermabond, no signs of infection   Foley with yellow urine     Objective:   Range of Vitals  Temp src: Core (12/11 2157)  Temp:  [36.2 C (97.2 F)-37 C (98.6 F)]   Pulse:  [68-87]   BP: (111-147)/(74-103)   Resp:  [7-16]   SpO2:  [97 %-100 %]     Current Vitals  Temp: 36.6 C (97.9 F) (12/11 2157)  Temp src: Core (12/11 2157)  Pulse: 82 (12/11 2157)  BP: 118/74 (12/11 2157)  Resp: 16 (12/11 2157)  SpO2: 97 % (12/11 2157)  Height: 185.4 cm (6\' 1" ) (12/11 1610)  Weight: 87.2 kg (192 lb 3.9 oz) (12/11 9604)    Intake/Output  I/O Last 2 Completed Shifts:  In: 2390 [Oral:890; Crystalloid:1500]  Out: 1585 [Urine:1275; Other:10]    Hospital Medications  Drips     Scheduled   Gentamicin 120 mg in NaCl (iso-osm) 100 mL IVPB, IV, Q8H Now, Last Rate: 120 mg (09/13/18 0247)  Heparin 5000 units/mL Injection 5,000 Units, SUBCUTANEOUS, Q8H (54,09,81)  Oxybutynin (DITROPAN XL) SR Tablet 15 mg, ORAL, QAM  Vancomycin (VANCOCIN) 1,000 mg in Dextrose (iso-osm) 200 mL IVPB, IV, Q12H Now, Last Rate: 1,000 mg (09/12/18 2203)      PRN  Calcium Carbonate (TUMS) Chewable Tablet 750 mg, ORAL, Q12H PRN  Fentanyl (SUBLIMAZE) Injection 25 mcg, IV, Q5MIN PRN  Hydromorphone (DILAUDID) Injection 0.2-0.4 mg, IV, Q15MIN PRN  Hydromorphone (DILAUDID) Injection 0.4 mg, IV, Q4H PRN  Ondansetron (ZOFRAN) Injection 4 mg, IV, PRN X 1  Ondansetron (ZOFRAN) Injection 4 mg, IV, Q8H PRN  Oxycodone (ROXICODONE) Tablet 5-10 mg, ORAL, Q4H PRN  Phenol (CHLORASEPTIC) 1.4 % Throat Spray 2 spray, Mouth/Throat, Q2H PRN        Home Medications  Lisinopril (PRINIVIL, ZESTRIL) 40 mg Tablet, Take 40 mg  by mouth every day at bedtime.  multivitamin with folic acid (THERA) 400 mcg Tablet, Take 1 tablet by mouth every morning.  Pravastatin (PRAVACHOL) 40 mg Tablet, Take 40 mg by mouth every day at bedtime.  Warfarin (COUMADIN) 7.5 mg Tablet, Take 7.5 mg by mouth every day at bedtime.        EXAM  Physical Exam  Gen:  Alert, communicative  Resp: Easy work of breathing on room air  Abdomen: Soft, appropriately tender, LLQ incision covered with dermabond   GU: Foley in place with clear urine, perineal incision covered with dermabond  Ext:  Moving all extremities       LABS/Imaging:  Recent labs for the past 72 hours     09/10/18 1010    WHITE BLOOD CELL COUNT 5.5    HEMOGLOBIN 15.1    HEMATOCRIT 45.2    PLATELET COUNT 158      Recent labs for the past 72 hours     09/12/18 0845    APTT --    INR 1.16     Recent labs for the past 72 hours     09/10/18 1010    SODIUM 140    POTASSIUM 4.2    CHLORIDE  104    CARBON DIOXIDE TOTAL 27    UREA NITROGEN, BLOOD (BUN) 15    CREATININE BLOOD 1.05    GLUCOSE 110*      Recent labs for the past 72 hours     09/10/18 1010    CALCIUM 9.5    CALCIUM ION WHOLE BLOOD --    MAGNESIUM (MG) --    PHOSPHORUS (PO4) --        Assessment  [age]yr male POD 1 s/p AUS revision.     Plan:   Neuro: tylenol, Norco 5  Cardiovascular: Resume home warfarin  Pulmonary: Pulmonary hygiene   FEN/GI: regular diet   GU: Foley removed this AM. PVR  ID/Heme: complete peri op abx (vanc and gent)  Endocrine: no issues   PPx: SCDs, heparin tid     Dispo: Home today. Discharged with a 7 day course of Bactrim. Wound check in 2 weeks with his local Urologist- per pt request [place] and [place] drive. Follow up with Dr. Nanda Quintonothschild in 6 weeks.     Electronically Signed on 09/13/2018 at 07:09 by:  Valerie RoysPhillip Kim, MD   Urologic Surgery, URO 2  PI# [id number]  Personal pager: [phone]  Urologic Surgery Service Pager: [phone]    I saw and evaluated the patient with the resident. Together, we reviewed all  pertinent images, labs, and reports for this patient and developed a care plan.  I agree with the resident's documentation of the care plan.    Griselda MinerJennifer Adaliah Hiegel, MD, MPH  Assistant Professor  Department of Urology  Male Pelvic Medicine and Reconstructive Surgery  [facility]  Clinic [phone]

## 2018-09-13 NOTE — Nurse Discharge Note (Signed)
Discharge to home via wheelchair to private auto at [facility].   Belongings sent home with family.  See EMR for assessment.  Iseah Plouff, RN

## 2018-09-13 NOTE — Anesthesia Postprocedure Evaluation (Addendum)
I concur with the resident's management, and approve discharge. The patient meets PACU discharge criteria. [name], MD  Attending Anesthesiologist [phone]    Patient: Charles Decker    REVISION, ARTIFICIAL URINARY SPHINCTER    Anesthesia Type: general    Stop Bang: 3    Vital Signs (Last Recorded):  BP: 129/81 (09/13/18 0700)  Pulse: 84 (09/13/18 0700)  Resp: 16 (09/13/18 0700)  Temp Max: 37 C (98.6 F)  (Last 24 hours)  Temp: 36.2 C (97.2 F) (09/13/18 0700)  SpO2: 99 % (09/13/18 0700) on    Device (Oxygen Therapy): room air (09/13/18 0715)      Anesthesia Post Evaluation    Procedure: Procedure(s):REVISION, ARTIFICIAL URINARY SPHINCTER  [facility]: [facility]  Anesthesia: General    Patient location during evaluation: patient's room  Patient participation: complete - patient participated  Level of consciousness: awake and alert  Pain score: 1  Pain management: adequate  Airway patency: patent  Anesthetic complications: no  Cardiovascular status: blood pressure returned to baseline and stable  Respiratory status: room air  Hydration status: euvolemic  Nausea and Vomiting: absent  Comments: Patient with adequate pain control after receiving no postoperative pain or nausea medications in [facility]. Tolerating water. Denies nausea. Ambulating to restroom on his own and voiding. Ready for discharge home with ride.     Reyne Dumasyan A Jones, MD          Reyne Dumasyan A Jones, MD

## 2018-09-13 NOTE — Discharge Instructions (Signed)
Discharge Instructions after Artificial Urinary Sphincter Placement:    Wound:  1. Your incision has already started the healing process. If you notice excessive drainage of liquid or blood from your incision, inform your doctor. You may have swelling and/or bruising of the scrotum. You may apply ice packs to the scrotum if the swelling persists.There are stitches in the skin where your incision was closed. These will dissolve over time.   2. Your artificial sphincter is "deactivated" when you leave the hospital. This means that you will leak urine and that you will want to wear adult diapers (eg. Depends). Call your physician if you notice blood in the urine or if your urine is not draining. Do not allow anyone to insert a catheter into your penis to drain your urine without first consulting your urologist.   3. You should feel for the pump in the left scrotum every day and using gentle traction pull it down in the scrotum    MEDICATIONS  1. Resume all home medications as directed. PLEASE RESUME YOUR COUMADIN STARTING TODAY. 09/13/18 AND FOLLOW UP WITH YOUR PRIMARY CARE DOCTOR TO RE-CHECK YOUR INR.  2. Start new discharge medications as directed.  3. Do not drive or operate machinery while taking narcotic pain medications.  4. You will be given a prescription for pain medication; continue using this as long as your pain persists. As soon as Extra Strength Tylenol is adequate, you can switch to this instead - it is less constipating. If you have severe pain that does not improve with the pain medication, call your doctor.   5. You will have a prescription for an antibiotic when you go home, take these medications as instructed; if you miss one dose, resume taking them on your previous schedule until you have completed the entire course of treatment.     ACTIVITY   1. May shower with soap 2 days after the surgery.  2. No baths or submersion in water (swimming pool, jacuzzi, etc) for the next 4-6 weeks.  3. Do  not exercise, lift heavy objects, return to work, or resume any form of sexual activity until you are told to do so by your urologist.   4. Do not wear tight underwear or scrotal supports.    DIET  1. Resume same diet prior to your hospital admission.    WOUND  1. Keep wound clean, dry and intact.     RETURN  1. Follow-up with Outpatient Urology clinic in 2 weeks (THIS CAN BE DONE WITH YOUR LOCAL UROLOGIST. PLEASE INFORM THEM TO UPDATE US OF THE WOUND STATUS) for wound check and 6 weeks for activation of the artifical urinary sphincter. Clinic Number: [phone]  2. Call to confirm your appointment.   3. Call [phone] or go to the Emergency Department near you for any symptoms such as fevers with temperature greater than 101.5 F, chills, persistent nausea and vomiting, severe pain not controlled with medications, pus drainage from the wound site, or for any acute problems or illnesses.

## 2018-09-13 NOTE — Nurse Assessment (Signed)
ASSESSMENT NOTE    Note Started: 09/12/2018, 1921     Initial assessment completed and recorded in EMR.  Report received from day shift nurse and orders reviewed. Patient is resting in bed comfortably. Bed is locked and at the lowest position with call light within reach. Plan of Care reviewed and updated, discussed with patient. Will continue to monitor patient. Charlett Langoherry G Andru Genter, RN

## 2018-09-13 NOTE — Discharge Summary (Addendum)
DEPARTMENT OF UROLOGY  HOSPITAL DISCHARGE SUMMARY    Note Started: 09/13/2018, 20:26  Admission Date: 09/12/2018  7:33 AM  Discharge Date: 09/13/2018   Admission Diagnosis: Urinary incontinence, unspecified type  (primary encounter diagnosis)  Discharge Diagnosis: same    PAST MEDICAL HISTORY:  Past Medical History:   Diagnosis Date    Chronic anticoagulation     warfarin since 2005 for afib (stop 5 days prior to surgery); home INR 2.2s (warfarin 7.5mg ) ->PCP Dr Helene ShoeBosh    Chronic atrial fibrillation 2005    on warfarin 7.5mg  qhs -> stop 5 days prior to surgery; no enoxaparin bridging; no CHF, MI, stroke    H/O prostatectomy 2008    prostate cancer -> voiding dysfunction (3 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    PROBLEM LIST AND MANAGEMENT:   Patient underwent the above procedure by Dr. Nanda Quintonothschild on 09/12/18.  For complete details of this procedure and its findings, please see the detailed operative note.  Patient tolerated the procedure well and was transferred to PACU in stable condition. Patient was started on PO pain medications and tolerated this well. He was given Ditropan in the PACU immediately post op.   On POD1, he was afebrile and VSS. He tolerated diet. Pain was well controlled. Wound was examined in the morning. Mild erythema was noted along the perineal incision. No drainage. After it was confirmed the device was deactivated, foley catheter was removed atraumatically. PVR was obtained to ensure he did not retain urine.   He was medically stable for discharge on POD 1.     LABS:  CBC   No results found for this basename: WBC:*,HGB:*,HCT:*,PLT:* in  the last 72 hours    BASIC METABOLIC PANEL   No results found for this basename: GLU:*,BUN:*,CR:*,NA:*,K:*,CL:*,CO2:*,CA:* in the last 72 hours  INR Recent labs for the past 72 hours     09/12/18 0845    INR 1.16     PT Recent labs for the past 72 hours     09/12/18 0845    PROTHROMBIN TIME 10.6    APTT --     No results found for: AMY, LIP  No results found for: AST, ALT, ALP, ALB, TP, TBIL     No results found for: CRFLD    Microbiology:  Blood culture: No results found for: BCULT        INFECTIONS / COMPLICATIONS:  None     DISPOSITION:  Patient is stable for discharge to: Home  Patient will follow up with 2 week wound check with local urologist. 6 week follow up with Dr. Nanda Quintonothschild.     MEDICATIONS PRESCRIBED for DISCHARGE:     Medication List      START taking these medications    DOK 100 mg Capsule  Generic drug:  Docusate  Take 1 capsule by mouth 2 times daily for 14 days.     Oxycodone 5 mg Tablet  Commonly known as:  ROXICODONE  Take 1 tablet by mouth every 4 hours if needed for pain.     Trimethoprim 160 mg/Sulfamethoxazole 800 mg  Tablet  Commonly known as:  BACTRIM DS  Take 1 tablet by mouth 2 times daily for 7 days.        ASK your doctor about these medications    Lisinopril 40 mg Tablet  Commonly known as:  PRINIVIL, ZESTRIL  Take 40 mg by mouth every day at bedtime.     Pravastatin 40 mg Tablet  Commonly known as:  PRAVACHOL  Take 40 mg by mouth every day at bedtime.     THERA 400 mcg Tablet  Generic drug:  multivitamin with folic acid  Take 1 tablet by mouth every morning.     Warfarin 7.5 mg Tablet  Commonly known as:  COUMADIN  Take 7.5 mg by mouth every day at bedtime.           Where to Get Your Medications      These medications were sent to [facility]  [address] [place] [address]    Hours:  0800-1900 Phone:  [phone]    DOK 100 mg Capsule   Oxycodone 5 mg Tablet   Trimethoprim 160 mg/Sulfamethoxazole 800 mg Tablet         Fax to PCP: Patient, No Pcp  Per    Valerie Roys, MD   Urologic Surgery, URO 2  PI# [id number]  Personal pager: [phone]  Urologic Surgery Service Pager: [phone]    I saw and evaluated the patient with the resident. Together, we reviewed all pertinent images, labs, and reports for this patient and developed a care plan.  I agree with the resident's documentation of the care plan.    Griselda Miner, MD, MPH  Assistant Professor  Department of Urology  Male Pelvic Medicine and Reconstructive Surgery  [facility] [phone]

## 2018-09-17 ENCOUNTER — Telehealth: Payer: Self-pay | Admitting: Urology

## 2018-09-17 NOTE — Telephone Encounter (Signed)
Spoke with

## 2018-09-20 ENCOUNTER — Telehealth: Payer: Self-pay | Admitting: Urology

## 2018-09-20 ENCOUNTER — Encounter: Payer: Self-pay | Admitting: Urology

## 2018-09-20 NOTE — Telephone Encounter (Signed)
Called patient. Identified by name.  Spoke with pt and his wife.     I reviewed recent my chart message, pt is doing well.      Pt reports finished his ABX today.  Reports both his wounds and scrotum are healing well, not indurated, no erythema, no drainage, no pain.  Tiny spot of minimal drainage at superior portion in perineal incision - near suture as can be expected. Pt denies fevers, chills, lack of appetite or malaise.  He is seeing his local urologist for post op wound check and will see me end of January to activate AUS.  If any questions or concerns asked that pt his wife or local urologist contact us. Pt agrees and is appreciative of phone call.  Pt states is leaking urine and able to void, and is leaking  A little bit less than normal.  Reassurance provided.   Griselda MinerJennifer Kevante Lunt, MD, MPH  Assistant Professor  Department of Urologic Surgery  Genitourinary Reconstructive Surgery, FPMRS  [facility]  Clinic [phone]

## 2018-09-21 ENCOUNTER — Encounter: Payer: Self-pay | Admitting: Urology

## 2018-09-21 NOTE — Telephone Encounter (Signed)
From: Otilio ConnorsWilliam Begay  To: Kristeen MansJennifer Gwendolyn Rothschild, MD  Sent: 09/21/2018 1:44 PM PST  Subject: Visit Follow-up Question    Thank you again for the follow up call yesterday. Here is a quick recap FYI:   day 9 following surgery....  * Cathi examines both wounds twice daily and is glad to report that all has looked well until last night, 09/20/18, in which she is finding some white pus looking material inside the suture from the top of the suture near the scrotum to not quite the middle of the suture. She is concerned that it could be or could turn into an infection. She is watching it closely and using triple antibiotic ointment. Is this OK? Do you want a picture?;  * No pain, only mild discomfort as new AUS and my old body are getting acquainted;  * Mild blood spotting on occasion. Nothing alarming;  * No heat or real hardness around on sutures;  * Scheduled a 10/01/2018 follow-up appointment with Dr. Judie GrieveBryan A. Mehlhaff, MD, Urologist, [place], [place]   [phone] and will give him your contact information should he need to get in touch.

## 2018-10-04 ENCOUNTER — Encounter: Payer: Self-pay | Admitting: Urology

## 2018-10-04 NOTE — Telephone Encounter (Signed)
From: Otilio Connors  To: Kristeen Mans, MD  Sent: 10/04/2018 12:44 PM PST  Subject: Preventive Care    2 week post-op wound care follow-up conducted by Dr. Leonarda Salon confirmed healing is going well and sees no issues. Todate, good urine color and flow, utilize 5-6 pads daily and still experience minor discomfort from scrotum incision when raising from or setting into a sitting position.

## 2018-10-29 ENCOUNTER — Ambulatory Visit: Payer: Medicare Other | Attending: Urology | Admitting: Urology

## 2018-10-29 ENCOUNTER — Encounter: Payer: Self-pay | Admitting: Urology

## 2018-10-29 ENCOUNTER — Other Ambulatory Visit: Payer: Self-pay

## 2018-10-29 ENCOUNTER — Telehealth: Payer: Self-pay | Admitting: Urology

## 2018-10-29 VITALS — BP 144/90 | HR 105

## 2018-10-29 DIAGNOSIS — B356 Tinea cruris: Secondary | ICD-10-CM | POA: Insufficient documentation

## 2018-10-29 DIAGNOSIS — Z87448 Personal history of other diseases of urinary system: Secondary | ICD-10-CM | POA: Insufficient documentation

## 2018-10-29 DIAGNOSIS — Z48816 Encounter for surgical aftercare following surgery on the genitourinary system: Principal | ICD-10-CM | POA: Insufficient documentation

## 2018-10-29 DIAGNOSIS — Z9689 Presence of other specified functional implants: Secondary | ICD-10-CM | POA: Insufficient documentation

## 2018-10-29 MED ORDER — NYSTATIN 100,000 UNIT/GRAM TOPICAL CREAM
TOPICAL_CREAM | Freq: Two times a day (BID) | TOPICAL | 0 refills | Status: AC
Start: 2018-10-29 — End: 2019-10-24
  Filled 2018-10-29: qty 30, 15d supply, fill #0

## 2018-10-29 NOTE — Nursing Note (Signed)
Patient identified using name and date of birth.  Vitals taken, chief complaint identified, drug allergies verified, screened for pain, medications reviewed, pharmacy confirmed. Ina Poupard H, LVN

## 2018-10-29 NOTE — Telephone Encounter (Signed)
Pt states he received a missed call from Dr.Rothschild and is returning her phone call      Please advise

## 2018-10-29 NOTE — Progress Notes (Signed)
No Pcp Per Patient  No address on file    EU:MPNTIRW Witmer  MRN: [id number]  DOB: Nov 07, 1945    Date of visit: 10/29/2018      Dear Patient, No Pcp Per:    Chief complaint:   Chief Complaint   Patient presents with    Consultation       Today in the Urology Clinic, I had the pleasure of seeing Charles Decker who is a 73yr male in follow-up regarding 6 week check up s/p AUS removal and replacement, complex. Pt with right inguinal mesh fro hernia repair, therefore at time of AUS removal and replacement reservoir was left in place.      Pt presents today with his wife. Denies fevers, chills, pain at incisions.  Since they live so far away, they elected to have wound check locally.  Reportedly no signs of infection.  Went to local ED due to worries of dermabond white color, had no signs of infection, I reviewed this note via care everywhere. Is leaking urine as expected.     Labs:  Lab Results   Lab Name Value Date/Time    NA 140 09/10/2018 10:10 AM    K 4.2 09/10/2018 10:10 AM    CL 104 09/10/2018 10:10 AM    CO2 27 09/10/2018 10:10 AM    BUN 15 09/10/2018 10:10 AM    CR 1.05 09/10/2018 10:10 AM    GLU 110 (H) 09/10/2018 10:10 AM     Lab Results   Lab Name Value Date/Time    WBC 5.5 09/10/2018 10:10 AM    HGB 15.1 09/10/2018 10:10 AM    HCT 45.2 09/10/2018 10:10 AM    PLT 158 09/10/2018 10:10 AM     Allergies:   Patient has no known allergies.     Current Outpatient Medications   Medication Sig Dispense Refill    Lisinopril (PRINIVIL, ZESTRIL) 40 mg Tablet Take 40 mg by mouth every day at bedtime.      multivitamin with folic acid (THERA) 400 mcg Tablet Take 1 tablet by mouth every morning.      Nystatin (MYCOSTATIN) 100,000 unit/gram Cream Apply to the affected area 2 times daily. 30 g 0    Pravastatin (PRAVACHOL) 40 mg Tablet Take 40 mg by mouth every day at bedtime.      Warfarin (COUMADIN) 7.5 mg Tablet Take 7.5 mg by mouth every day at bedtime.       No current facility-administered medications for this  visit.        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    Hypertension     Urinary incontinence     postprostatectmy since 2008        Past Surgical History:  Past Surgical History:   Procedure Laterality Date    ------------OTHER-------------      07/2007 robotic  Prostatectomy -->   prostatectomy stress  Urinary incontinence  -> [facility])  04/2009 AUS#1 (cycled normally , did not improve incontinence) --> AUS #2 (single - double cuff)  Complicated with hernia  -->  ([facility]  04/2012  ) AUS #3 reposition  &  repair of right inguinal hernia with mesh    COLONOSCOPY      2013    STRABISMUS CORRECTION, 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    HEENT: NC/AT  CV: pulses intact  Resp: unlabored breathing  Abd: soft, NT, ND  Skin: no excoriations, lacerations, or rashes  Extremities:  no cyanosis, clubbing, or edema.  Neuro: Gait normal. Reflexes normal and symmetric. Sensation and strength grossly normal.  GU Exam:   Perineal incision CDI, healing well.  Left lower quadrant incision CDI, dermabond easily removed.  No  signs of infection along tubing or cuff, soft non tender no induration.  AUS left scrotum, activated and cycled by myself.     Impression:   73 year old male with hx of post prostatectomy urinary stress incontinence, s/p AUS removal and replacement (this is his 4th surgery for AUS revision, done previously elswehere).  AUS was activated today.  He is doing well.    Plan:    Extensive teaching was provided for patient by myself and also was given written instructions and a take home AUS pump model.   Pt was instructed to obtain medical alert necklace or bracelet to declare that he has an Artifical Urinary Sphincter and that it needs to be deactivated prior to catheter placement.  Pt was instructed to go get lunch or coffee and try out his new AUS locally and return to our clinic today should he have any problems with manipulating it himself.  It was again re-iterated that the AUS may not completely stop his urinary incontinence and that his expectations should be appropriate such that it can offer an improvement of his urinary incontinence as well as quality of life. Revision surgery is an option if indicated.  Signs and symptoms of erosion and infection were also reviewed with the patient.  The patient expressed understanding of all the above education and demonstrated satisfactory use of the AUS pump and cycling of the AUS system.     RTC PRN  Update per my chart in a month or two. Pt lives far away, asked that he keep me posted, I am happy to see him as needed.  Answer any questions in the future.      Approximately 25 minutes were spent with patient,  with more than 50% of time spent counseling the patient on the assessment and plan as above. We discussed the indications for treatment. The patient has agreed to the recommended treatment.     I did review patient's past medical and family/social history.  Barriers to Learning assessed: none. Patient verbalizes understanding of teaching and instructions.    Thank  you for the opportunity to participate in the health care of your patient.  I will keep you appraised of any further urologic interventions.      Sincerely,  Electronically signed:  Victorino Dike  Julienne Kass, MD, MPH  Assistant Professor  Department of Urologic Surgery  Genitourinary Reconstructive Surgery, [facility] [facility] [phone]

## 2019-10-19 ENCOUNTER — Other Ambulatory Visit: Payer: Self-pay | Admitting: Family Medicine
